# Patient Record
Sex: Female | Born: 1960
Health system: Southern US, Community
[De-identification: ages and names within clinical notes are randomized; demographics above are authoritative.]

## PROBLEM LIST (undated history)

## (undated) DIAGNOSIS — E559 Vitamin D deficiency, unspecified: Secondary | ICD-10-CM

## (undated) DIAGNOSIS — D72829 Elevated white blood cell count, unspecified: Secondary | ICD-10-CM

## (undated) DIAGNOSIS — E8881 Metabolic syndrome: Secondary | ICD-10-CM

## (undated) DIAGNOSIS — K649 Unspecified hemorrhoids: Secondary | ICD-10-CM

## (undated) DIAGNOSIS — I248 Other forms of acute ischemic heart disease: Secondary | ICD-10-CM

## (undated) DIAGNOSIS — Z6830 Body mass index (BMI) 30.0-30.9, adult: Secondary | ICD-10-CM

## (undated) DIAGNOSIS — I480 Paroxysmal atrial fibrillation: Secondary | ICD-10-CM

## (undated) DIAGNOSIS — N179 Acute kidney failure, unspecified: Secondary | ICD-10-CM

## (undated) DIAGNOSIS — J069 Acute upper respiratory infection, unspecified: Secondary | ICD-10-CM

## (undated) DIAGNOSIS — E669 Obesity, unspecified: Secondary | ICD-10-CM

## (undated) DIAGNOSIS — N840 Polyp of corpus uteri: Secondary | ICD-10-CM

## (undated) DIAGNOSIS — B9789 Other viral agents as the cause of diseases classified elsewhere: Secondary | ICD-10-CM

## (undated) DIAGNOSIS — Z9289 Personal history of other medical treatment: Secondary | ICD-10-CM

## (undated) HISTORY — DX: Obesity, unspecified: E66.9

## (undated) HISTORY — PX: OTHER SURGICAL HISTORY: SHX169

## (undated) HISTORY — PX: CERVICAL POLYPECTOMY: SHX88

## (undated) HISTORY — DX: Acute upper respiratory infection, unspecified: J06.9

## (undated) HISTORY — DX: Metabolic syndrome: E88.81

## (undated) HISTORY — DX: Acute kidney failure, unspecified: N17.9

## (undated) HISTORY — DX: Other forms of acute ischemic heart disease: I24.8

## (undated) HISTORY — DX: Unspecified hemorrhoids: K64.9

## (undated) HISTORY — DX: Elevated white blood cell count, unspecified: D72.829

## (undated) HISTORY — DX: Vitamin D deficiency, unspecified: E55.9

## (undated) HISTORY — DX: Other viral agents as the cause of diseases classified elsewhere: B97.89

## (undated) HISTORY — DX: Paroxysmal atrial fibrillation: I48.0

## (undated) HISTORY — DX: Personal history of other medical treatment: Z92.89

## (undated) HISTORY — DX: Body mass index (bmi) 30.0-30.9, adult: Z68.30

## (undated) HISTORY — PX: TUBAL LIGATION: SHX77

---

## 1999-09-14 ENCOUNTER — Other Ambulatory Visit: Admission: RE | Admit: 1999-09-14 | Discharge: 1999-09-14 | Payer: Self-pay | Admitting: Obstetrics and Gynecology

## 2000-08-13 ENCOUNTER — Encounter: Admission: RE | Admit: 2000-08-13 | Discharge: 2000-11-11 | Payer: Self-pay | Admitting: *Deleted

## 2000-08-26 ENCOUNTER — Encounter: Admission: RE | Admit: 2000-08-26 | Discharge: 2000-11-24 | Payer: Self-pay | Admitting: Orthopedic Surgery

## 2001-02-12 ENCOUNTER — Other Ambulatory Visit: Admission: RE | Admit: 2001-02-12 | Discharge: 2001-02-12 | Payer: Self-pay | Admitting: Obstetrics and Gynecology

## 2002-10-04 ENCOUNTER — Encounter: Payer: Self-pay | Admitting: Emergency Medicine

## 2002-10-04 ENCOUNTER — Emergency Department (HOSPITAL_COMMUNITY): Admission: EM | Admit: 2002-10-04 | Discharge: 2002-10-04 | Payer: Self-pay | Admitting: Emergency Medicine

## 2004-03-02 ENCOUNTER — Encounter: Admission: RE | Admit: 2004-03-02 | Discharge: 2004-03-02 | Payer: Self-pay | Admitting: Obstetrics and Gynecology

## 2004-03-23 ENCOUNTER — Other Ambulatory Visit: Admission: RE | Admit: 2004-03-23 | Discharge: 2004-03-23 | Payer: Self-pay | Admitting: Obstetrics and Gynecology

## 2007-09-25 ENCOUNTER — Ambulatory Visit (HOSPITAL_COMMUNITY): Admission: RE | Admit: 2007-09-25 | Discharge: 2007-09-25 | Payer: Self-pay | Admitting: Obstetrics and Gynecology

## 2007-09-25 ENCOUNTER — Encounter (INDEPENDENT_AMBULATORY_CARE_PROVIDER_SITE_OTHER): Payer: Self-pay | Admitting: Obstetrics and Gynecology

## 2010-03-19 ENCOUNTER — Encounter: Admission: RE | Admit: 2010-03-19 | Discharge: 2010-03-19 | Payer: Self-pay | Admitting: Gastroenterology

## 2011-03-23 ENCOUNTER — Inpatient Hospital Stay (INDEPENDENT_AMBULATORY_CARE_PROVIDER_SITE_OTHER)
Admission: RE | Admit: 2011-03-23 | Discharge: 2011-03-23 | Disposition: A | Discharge status: Home / Self Care | Payer: 59 | Source: Ambulatory Visit | Attending: Family Medicine | Admitting: Family Medicine

## 2011-03-23 ENCOUNTER — Ambulatory Visit (INDEPENDENT_AMBULATORY_CARE_PROVIDER_SITE_OTHER): Payer: 59

## 2011-03-23 DIAGNOSIS — S63509A Unspecified sprain of unspecified wrist, initial encounter: Secondary | ICD-10-CM

## 2011-03-23 DIAGNOSIS — IMO0002 Reserved for concepts with insufficient information to code with codable children: Secondary | ICD-10-CM

## 2011-04-02 NOTE — Op Note (Signed)
Lindsey Bishop, TRAVAGLINI                 ACCOUNT NO.:  [id number]   MEDICAL RECORD NO.:  [id number]          PATIENT TYPE:  AMB   LOCATION:  SDC                           FACILITY:  [facility]   PHYSICIAN:  [name], [name]   DATE OF BIRTH:  11-Oct-1961   DATE OF PROCEDURE:  09/25/2007  DATE OF DISCHARGE:                               OPERATIVE REPORT   PREOPERATIVE DIAGNOSIS:  Endometrial polyp.   POSTOPERATIVE DIAGNOSIS:  Endometrial polyp.   PROCEDURE:  Paracervical block.  Hysteroscopy, resection of polyps and  endometrial curettings.   SURGEON:  [name], [name]   ANESTHESIA:  Was sedation with paracervical block.   ESTIMATED BLOOD LOSS:  Minimal.   PACKS AND DRAINS:  None.   INTRAOPERATIVE BLOOD REPLACED:  None.   COMPLICATIONS:  None.   INDICATIONS:  Dictated history and physical.   PROCEDURE AS FOLLOWS:  The patient was taken to OR, placed in supine  position.  After satisfactory level of sedation, the patient placed in  the dorsal spine position using Allen stirrups.  Patient then draped in  a sterile field.  Speculum was placed in the vaginal vault.  Cervix and  vagina were cleansed with Betadine.  Paracervical block was instituted  using 1% Nesacaine.  Cervix was grasped with a single-toothed tenaculum.  Uterus sounded to approximately 8 cm.  Cervix serially dilated to size  35 Pratt dilator.  Operative hysteroscope was introduced.  Intrauterine  cavity was distended using sorbitol.  Visualization revealed several  small polyps on the anterior uterine wall.  These were resected and sent  for pathological review.  Multiple endometrial biopsies were obtained  along with curettings.  Total deficit was 50 mL.  At this point in time,  speculum and single-toothed tenaculum were removed.  The patient was  taken out of the dorsal lithotomy position.  Once alert, transferred to  recovery room in good condition.  Sponge, instrument and needle count  was reported as correct by  the circulating nurse.      [name], [name]  Electronically Signed     JSM/MEDQ  D:  09/25/2007  T:  09/25/2007  Job:  [id number]

## 2011-04-02 NOTE — H&P (Signed)
NAME:  Lindsey Bishop, Lindsey Bishop NO.:  [id number]   MEDICAL RECORD NO.:  [id number]           PATIENT TYPE:   LOCATION:                                 FACILITY:   PHYSICIAN:  [name], [name]   DATE OF BIRTH:  1961/11/05   DATE OF ADMISSION:  09/25/2007  DATE OF DISCHARGE:  09/25/2007                              HISTORY & PHYSICAL   The patient is a 50 year old gravida 5, para 4, abortus 1 female who  presents for hysteroscopy.   In relation to the present admission the patient is having increasing  menstrual irregularities with prolonged heavy cycles.  She underwent a  saline infusion ultrasound that revealed a large endometrial polyp.  She  now proceeds for hysteroscopic resection.   ALLERGIES:  No known drug allergies.   MEDICATIONS:  None.   PAST MEDICAL HISTORY:  Usual childhood diseases.  No significant  sequelae.   PAST SURGICAL HISTORY:  She has had knee surgery.  She also had a prior  cesarean section.  She has had two vaginal deliveries and one  miscarriage.   SOCIAL HISTORY:  Reveals no tobacco or alcohol use.   FAMILY HISTORY:  History of diabetes as well as heart attack.   REVIEW OF SYSTEMS:  Noncontributory.   PHYSICAL EXAMINATION:  VITAL SIGNS:  Afebrile, stable vital signs.  HEENT:  Normocephalic.  Pupils equal, round, and reactive to light and  accommodation.  Extraocular movements were intact.  Sclerae and  conjunctivae are clear.  Oropharynx clear.  NECK:  Without thyromegaly.  BREASTS:  No discrete masses.  LUNGS:  Clear.  CARDIOVASCULAR:  Regular rhythm and rate without murmurs or gallops.  ABDOMEN:  Benign.  No mass, organomegaly or tenderness.  PELVIC:  Normal external genitalia.  Vaginal mucosa is clear.  Cervix  unremarkable.  Uterus normal size, shape, and contour.  Adnexa free of  masses or tenderness.  EXTREMITIES:  Trace edema.  NEUROLOGICAL:  Grossly within normal limits.   IMPRESSION:  Menorrhagia with associated  endometrial polyp.   PLAN:  The patient to undergo hysteroscopic evaluation and resection.  Risks have been discussed including the risk of infection.  The risk of  hemorrhage could require transfusion with the risk of AIDS or hepatitis.  Risk of injury to adjacent organs including bladder, bowel, ureters that  could require further exploratory surgery.  Risk of deep venous  thrombosis and pulmonary emboli.  The patient expressed understanding of  indications and risks.      [name], [name]  Electronically Signed     JSM/MEDQ  D:  09/25/2007  T:  09/25/2007  Job:  [id number]

## 2011-08-27 LAB — CBC
Platelets: 255
WBC: 7.1

## 2011-08-27 LAB — HCG, SERUM, QUALITATIVE: Preg, Serum: NEGATIVE

## 2011-10-24 ENCOUNTER — Other Ambulatory Visit: Payer: Self-pay | Admitting: Obstetrics and Gynecology

## 2011-10-24 DIAGNOSIS — R928 Other abnormal and inconclusive findings on diagnostic imaging of breast: Secondary | ICD-10-CM

## 2011-10-25 ENCOUNTER — Ambulatory Visit
Admission: RE | Admit: 2011-10-25 | Discharge: 2011-10-25 | Disposition: A | Discharge status: Home / Self Care | Payer: 59 | Source: Ambulatory Visit | Attending: Obstetrics and Gynecology | Admitting: Obstetrics and Gynecology

## 2011-10-25 DIAGNOSIS — R928 Other abnormal and inconclusive findings on diagnostic imaging of breast: Secondary | ICD-10-CM

## 2013-12-02 ENCOUNTER — Ambulatory Visit: Payer: 59 | Admitting: Cardiology

## 2014-09-26 ENCOUNTER — Telehealth: Payer: Self-pay | Admitting: Cardiology

## 2014-09-26 NOTE — Telephone Encounter (Signed)
Spoke with patient who reports increased episodes of a fib. She is out of Metoprolol. States its making her very anxious. i recommended she see a PA tomorrow, and she is agreeable. Appointment with Kathleen Argue at 10:30 am.

## 2014-09-26 NOTE — Telephone Encounter (Signed)
Ms.Kearse is stating that she is having episodes of AFib and cannot wait until next week to see the doctor .She have had 5 episodes the weekend before last and had one on last night around 11pm . Please call   Thanks

## 2014-09-26 NOTE — Telephone Encounter (Signed)
New problem   Pt stated she saw Dr Marlou Porch year ago\. Pt is having problems with afib and want an appt asap b/c she has given out of her meds since the weekend. Please call pt.I couldn't verify the exact date the the pt was actually here. Pt didn't want to see a PA/NP.

## 2014-09-27 ENCOUNTER — Encounter: Payer: Self-pay | Admitting: Physician Assistant

## 2014-09-27 ENCOUNTER — Ambulatory Visit (INDEPENDENT_AMBULATORY_CARE_PROVIDER_SITE_OTHER): Payer: 59 | Admitting: Physician Assistant

## 2014-09-27 VITALS — BP 130/85 | HR 65 | Ht 71.0 in | Wt 214.0 lb

## 2014-09-27 DIAGNOSIS — I480 Paroxysmal atrial fibrillation: Secondary | ICD-10-CM

## 2014-09-27 DIAGNOSIS — R002 Palpitations: Secondary | ICD-10-CM

## 2014-09-27 LAB — BASIC METABOLIC PANEL
BUN: 19 mg/dL (ref 6–23)
CALCIUM: 9.8 mg/dL (ref 8.4–10.5)
CO2: 26 meq/L (ref 19–32)
Chloride: 108 mEq/L (ref 96–112)
Creatinine, Ser: 0.8 mg/dL (ref 0.4–1.2)
GFR: 81.12 mL/min (ref >60.00)
GLUCOSE: 89 mg/dL (ref 70–99)
Potassium: 4 mEq/L (ref 3.5–5.1)
SODIUM: 138 meq/L (ref 135–145)

## 2014-09-27 LAB — TSH: TSH: 1.59 u[IU]/mL (ref 0.35–4.50)

## 2014-09-27 MED ORDER — METOPROLOL TARTRATE 25 MG PO TABS: 12.5000 mg | ORAL_TABLET | Freq: Two times a day (BID) | ORAL | Status: DC

## 2014-09-27 NOTE — Progress Notes (Addendum)
Cardiology Office Note   Date:  09/27/2014   ID:  Lindsey Bishop, DOB 27-Sep-1961, MRN [id number]  PCP:  No primary care provider on file.  Cardiologist:  Dr. Candee Furbish     History of Present Illness: Lindsey Bishop is a 53 y.o. female with a hx of PAF.  She had drug induced Hepatitis from [facility] in 2011.  She has taken prn Metoprolol in the past for recurrent AFib/palpitations.  She last saw Dr. Candee Furbish in 11/2012.  She returns today for the evaluation of recurrent AFib.  She's had 4 episodes of probable atrial fibrillation over last several weeks. She has noted increased stress. Her episodes of atrial fibrillation seem to be more frequent. Her most recent episode lasted about 18 hours and stopped yesterday afternoon.  She feels lightheaded with this as well as fatigue. She did have one episode while doing exertion (yard work).  She also has episodes at rest.  She denies chest pain or significant dyspnea. She denies orthopnea, PND or edema. She denies syncope. She denies a history of snoring or significant daytime hypersomnolence.   Recent Labs/Images:  No results found for requested labs within last 365 days.     Wt Readings from Last 3 Encounters:  09/27/14 214 lb (97.07 kg)     Past Medical History  Diagnosis Date  . PAF (paroxysmal atrial fibrillation)     a. Multaq 4/11 >>> acute hepatitis (AST 1215, ALT 1343) - Multaq DC'd    Current Outpatient Prescriptions  Medication Sig Dispense Refill  . metoprolol tartrate (LOPRESSOR) 25 MG tablet Take 0.5 tablets (12.5 mg total) by mouth 2 (two) times daily. Ok to take extra 1/2-1 tab prn for palpatations 30 tablet 11   No current facility-administered medications for this visit.     Allergies:   Multaq and Meperidine   Social History:  The patient  reports that she has never smoked. She does not have any smokeless tobacco history on file.   Family History:  [name] family history includes Diabetes in her  brother, father, maternal grandmother, and other family members; Heart attack in her maternal grandmother. There is no history of Stroke.   ROS:  Please see the history of present illness.    All other systems reviewed and negative.    PHYSICAL EXAM: VS:  BP 130/85 mmHg  Pulse 65  Ht 5\' 11"  (1.803 m)  Wt 214 lb (97.07 kg)  BMI 29.86 kg/m2 Well nourished, well developed, in no acute distress HEENT: normal Neck: no JVD Cardiac:  normal S1, S2; RRR; no murmur Lungs:  clear to auscultation bilaterally, no wheezing, rhonchi or rales Abd: soft, nontender, no hepatomegaly Ext: no edema Skin: warm and dry Neuro:  CNs 2-12 intact, no focal abnormalities noted  EKG:  NSR, HR 65, rightward axis      ASSESSMENT AND PLAN:  1.  Paroxysmal atrial fibrillation:  Patient notes recurrent episodes of probable atrial fibrillation. She's not certain if this is more related to increased stress or overall increased frequency. She's had some symptoms with exertion. She has also had some symptoms at rest. She had significant drug-induced hepatitis in 2011 in the setting of Multaq. She is hesitant to try any other antiarrhythmic drugs. She is interested in finding out more information regarding PVI ablation and whether or not she is a candidate.  CHADS2-VASc=1 (gender).       -  Change metoprolol tartrate to 12.5 mg twice a day.    -  She can take an extra 12.5-25 mg daily as needed.    -  Check BMET, TSH.    -  Arrange 2-D echocardiogram to rule out significant atriopathy, LV dysfunction, structural heart disease.    -  Arrange ETT to rule out exercise-induced arrhythmias, ischemia.    -  Arrange Event monitor to assess AFib burden.    -  Could consider class IC agent if ischemia ruled out.  Again, she is quite hesitant to do this.  Disposition:   Refer to Dr. Thompson Grayer in the AFib clinic to FU in 6 weeks.   Signed, Versie Starks, MHS 09/27/2014 12:27 PM    [facility]  [facility], [place], [place]  [address] Phone: [phone]; Fax: [phone]

## 2014-09-27 NOTE — Patient Instructions (Signed)
START METOPROLOL 12.5 MG TWICE DAILY; OK TO TAKE EXTRA 1/2-1 TABLET AS NEEDED FOR PALPITATIONS  LAB WORK TODAY; BMET, TSH  Your physician has requested that you have an echocardiogram. Echocardiography is a painless test that uses sound waves to create images of your heart. It provides your doctor with information about the size and shape of your heart and how well your heart's chambers and valves are working. This procedure takes approximately one hour. There are no restrictions for this procedure.  Your physician has requested that you have an exercise tolerance test. For further information please visit [email or web address]. Please also follow instruction sheet, as given.  Your physician has recommended that you wear an event monitor. Event monitors are medical devices that record the heart's electrical activity. Doctors most often [place] these monitors to diagnose arrhythmias. Arrhythmias are problems with the speed or rhythm of the heartbeat. The monitor is a small, portable device. You can wear one while you do your normal daily activities. This is usually used to diagnose what is causing palpitations/syncope (passing out).  You have been referred to A-FIB CLINIC WITH DR. ALLRED 6 WEEKS

## 2014-09-29 ENCOUNTER — Encounter: Payer: Self-pay | Admitting: Physician Assistant

## 2014-09-29 ENCOUNTER — Encounter: Payer: Self-pay | Admitting: *Deleted

## 2014-09-29 ENCOUNTER — Ambulatory Visit (INDEPENDENT_AMBULATORY_CARE_PROVIDER_SITE_OTHER): Payer: 59 | Admitting: Radiology

## 2014-09-29 ENCOUNTER — Ambulatory Visit (HOSPITAL_COMMUNITY): Payer: 59 | Attending: Cardiology

## 2014-09-29 DIAGNOSIS — R002 Palpitations: Secondary | ICD-10-CM

## 2014-09-29 DIAGNOSIS — I480 Paroxysmal atrial fibrillation: Secondary | ICD-10-CM

## 2014-09-29 DIAGNOSIS — I4891 Unspecified atrial fibrillation: Secondary | ICD-10-CM | POA: Diagnosis present

## 2014-09-29 NOTE — Progress Notes (Signed)
Patient ID: Lindsey Bishop, female   DOB: [birth year], 53 y.o.   MRN: [id number] Lifewatch 30 day cardiac event monitor applied to patient.

## 2014-09-29 NOTE — Progress Notes (Signed)
2D Echo completed. 09/29/2014

## 2014-09-29 NOTE — Progress Notes (Signed)
Patient ID: Lindsey Bishop, female   DOB: [birth year], 53 y.o.   MRN: [id number] Lifewatch 30 day cardiac event monitor applied to patient.

## 2014-09-30 ENCOUNTER — Telehealth: Payer: Self-pay | Admitting: *Deleted

## 2014-09-30 NOTE — Telephone Encounter (Signed)
pt notified about echo results with verbal understanding 

## 2014-10-05 ENCOUNTER — Ambulatory Visit: Payer: 59 | Admitting: Cardiology

## 2014-10-21 ENCOUNTER — Ambulatory Visit: Payer: 59 | Admitting: Physician Assistant

## 2014-10-27 ENCOUNTER — Encounter: Payer: Self-pay | Admitting: *Deleted

## 2014-10-31 ENCOUNTER — Encounter: Payer: 59 | Admitting: Physician Assistant

## 2014-11-07 ENCOUNTER — Telehealth: Payer: Self-pay | Admitting: *Deleted

## 2014-11-07 NOTE — Telephone Encounter (Signed)
Pt returned call

## 2014-11-07 NOTE — Telephone Encounter (Signed)
Left message for pt to call back for results.

## 2014-11-07 NOTE — Telephone Encounter (Signed)
Left message for pt to call back to discuss results of event monitor which demonstrated rare At Fib per Dr Marlou Porch.  No new orders were given.  She is to keep her appt as scheduled by Richardson Dopp with Dr Rayann Heman 12/23.

## 2014-11-07 NOTE — Telephone Encounter (Signed)
New Message  Pt returning [place] phone call; please call back and discuss.

## 2014-11-07 NOTE — Telephone Encounter (Signed)
Pt aware of results and will follow up as scheduled.

## 2014-11-09 ENCOUNTER — Ambulatory Visit (INDEPENDENT_AMBULATORY_CARE_PROVIDER_SITE_OTHER): Payer: 59 | Admitting: Internal Medicine

## 2014-11-09 ENCOUNTER — Encounter: Payer: Self-pay | Admitting: Internal Medicine

## 2014-11-09 VITALS — BP 100/80 | HR 63 | Ht 71.0 in | Wt 214.8 lb

## 2014-11-09 DIAGNOSIS — I480 Paroxysmal atrial fibrillation: Secondary | ICD-10-CM

## 2014-11-09 NOTE — Patient Instructions (Signed)
Your physician wants you to follow-up in: 6 months with Dr. Marlou Porch.You will receive a reminder letter in the mail two months in advance. If you don't receive a letter, please call our office to schedule the follow-up appointment.   As needed with Dr. Rayann Heman.

## 2014-11-10 ENCOUNTER — Encounter: Payer: Self-pay | Admitting: Internal Medicine
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
## 2014-11-17 ENCOUNTER — Encounter (HOSPITAL_COMMUNITY): Payer: Self-pay | Admitting: Emergency Medicine

## 2014-11-17 ENCOUNTER — Emergency Department (HOSPITAL_COMMUNITY)
Admission: EM | Admit: 2014-11-17 | Discharge: 2014-11-17 | Disposition: A | Discharge status: Home / Self Care | Payer: 59 | Attending: Emergency Medicine | Admitting: Emergency Medicine

## 2014-11-17 DIAGNOSIS — Z8679 Personal history of other diseases of the circulatory system: Secondary | ICD-10-CM | POA: Insufficient documentation

## 2014-11-17 DIAGNOSIS — M5440 Lumbago with sciatica, unspecified side: Secondary | ICD-10-CM | POA: Diagnosis not present

## 2014-11-17 DIAGNOSIS — Z79899 Other long term (current) drug therapy: Secondary | ICD-10-CM | POA: Insufficient documentation

## 2014-11-17 DIAGNOSIS — M545 Low back pain: Secondary | ICD-10-CM | POA: Diagnosis present

## 2014-11-17 HISTORY — DX: Polyp of corpus uteri: N84.0

## 2014-11-17 MED ORDER — OXYCODONE HCL 5 MG PO TABS: 5.0000 mg | ORAL_TABLET | ORAL | Status: DC | PRN

## 2014-11-17 MED ORDER — OXYCODONE HCL 5 MG PO TABS
5.0000 mg | ORAL_TABLET | Freq: Once | ORAL | Status: AC
  Administered 2014-11-17: 5 mg via ORAL
  Filled 2014-11-17: qty 1

## 2014-11-17 MED ORDER — PREDNISONE 20 MG PO TABS: 40.0000 mg | ORAL_TABLET | Freq: Every day | ORAL | Status: DC

## 2014-11-17 MED ORDER — MELOXICAM 7.5 MG PO TABS: 15.0000 mg | ORAL_TABLET | Freq: Every day | ORAL | Status: DC

## 2014-11-17 MED ORDER — HYDROMORPHONE HCL 1 MG/ML IJ SOLN
1.0000 mg | Freq: Once | INTRAMUSCULAR | Status: AC
  Administered 2014-11-17: 1 mg via INTRAMUSCULAR
  Filled 2014-11-17: qty 1

## 2014-11-17 MED ORDER — KETOROLAC TROMETHAMINE 60 MG/2ML IM SOLN
60.0000 mg | Freq: Once | INTRAMUSCULAR | Status: AC
  Administered 2014-11-17: 60 mg via INTRAMUSCULAR
  Filled 2014-11-17: qty 2

## 2014-11-17 MED ORDER — DEXAMETHASONE SODIUM PHOSPHATE 10 MG/ML IJ SOLN
10.0000 mg | Freq: Once | INTRAMUSCULAR | Status: AC
  Administered 2014-11-17: 10 mg via INTRAMUSCULAR
  Filled 2014-11-17: qty 1

## 2014-11-17 NOTE — ED Notes (Signed)
Pt reports bulgding disc at L5, has been given injections 10 years ago, and began having intense back pain yesterday. Seen by Dr. Tonita Cong, [facility], yesterday and placed on Tramadol until she can be seen next week for injections again. Pt stated tonight the pain became unbearable. Pt ambulatory with steady gait to triage.

## 2014-11-17 NOTE — ED Notes (Signed)
Pt ambulating independently w/ steady gait on d/c in no acute distress, A&Ox4. D/c instructions reviewed w/ pt and family - pt and family deny any further questions or concerns at present. Rx given x3  

## 2014-11-17 NOTE — Discharge Instructions (Signed)
Take prednisone and Mobic as prescribed for symptoms. You may take oxycodone as prescribed for breakthrough pain control. Recommend that you refrain from strenuous activity or heavy lifting. Apply ice and heat to your back daily, at least 3-4 times per day. Follow-up with your orthopedist for further management of her symptoms. You may return to the emergency department as needed if symptoms worsen.  Back Pain, Adult Low back pain is very common. About 1 in 5 people have back pain.The cause of low back pain is rarely dangerous. The pain often gets better over time.About half of people with a sudden onset of back pain feel better in just 2 weeks. About 8 in 10 people feel better by 6 weeks.  CAUSES Some common causes of back pain include:  Strain of the muscles or ligaments supporting the spine.  Wear and tear (degeneration) of the spinal discs.  Arthritis.  Direct injury to the back. DIAGNOSIS Most of the time, the direct cause of low back pain is not known.However, back pain can be treated effectively even when the exact cause of the pain is unknown.Answering your caregiver's questions about your overall health and symptoms is one of the most accurate ways to make sure the cause of your pain is not dangerous. If your caregiver needs more information, he or she may order lab work or imaging tests (X-rays or MRIs).However, even if imaging tests show changes in your back, this usually does not require surgery. HOME CARE INSTRUCTIONS For many people, back pain returns.Since low back pain is rarely dangerous, it is often a condition that people can learn to [facility] their own.   Remain active. It is stressful on the back to sit or stand in one place. Do not sit, drive, or stand in one place for more than 30 minutes at a time. Take short walks on level surfaces as soon as pain allows.Try to increase the length of time you walk each day.  Do not stay in bed.Resting more than 1 or 2 days can  delay your recovery.  Do not avoid exercise or work.Your body is made to move.It is not dangerous to be active, even though your back may hurt.Your back will likely heal faster if you return to being active before your pain is gone.  Pay attention to your body when you bend and lift. Many people have less discomfortwhen lifting if they bend their knees, keep the load close to their bodies,and avoid twisting. Often, the most comfortable positions are those that put less stress on your recovering back.  Find a comfortable position to sleep. Use a firm mattress and lie on your side with your knees slightly bent. If you lie on your back, put a pillow under your knees.  Only take over-the-counter or prescription medicines as directed by your caregiver. Over-the-counter medicines to reduce pain and inflammation are often the most helpful.Your caregiver may prescribe muscle relaxant drugs.These medicines help dull your pain so you can more quickly return to your normal activities and healthy exercise.  Put ice on the injured area.  Put ice in a plastic bag.  Place a towel between your skin and the bag.  Leave the ice on for 15-20 minutes, 03-04 times a day for the first 2 to 3 days. After that, ice and heat may be alternated to reduce pain and spasms.  Ask your caregiver about trying back exercises and gentle massage. This may be of some benefit.  Avoid feeling anxious or stressed.Stress increases muscle tension  and can worsen back pain.It is important to recognize when you are anxious or stressed and learn ways to manage it.Exercise is a great option. SEEK MEDICAL CARE IF:  You have pain that is not relieved with rest or medicine.  You have pain that does not improve in 1 week.  You have new symptoms.  You are generally not feeling well. SEEK IMMEDIATE MEDICAL CARE IF:   You have pain that radiates from your back into your legs.  You develop new bowel or bladder control  problems.  You have unusual weakness or numbness in your arms or legs.  You develop nausea or vomiting.  You develop abdominal pain.  You feel faint. Document Released: 11/04/2005 Document Revised: 05/05/2012 Document Reviewed: 03/08/2014 [facility] Patient Information 2015 [place], [place]. This information is not intended to replace advice given to you by your health care provider. Make sure you discuss any questions you have with your health care provider.
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
## 2014-11-17 NOTE — ED Notes (Signed)
Pt reports acute on chronic back pain x2 days, states she saw her PCP, Dr. Maxie Better and was told she was going to be scheduled for "injections" to help improve pt's pain however pt states tonight pain is acutely worse - she is unable to sit, stand or lie down. Pt pleasant and cooperative, A&Ox4. Denies any known mechanism of injury.

## 2014-12-23 ENCOUNTER — Emergency Department (HOSPITAL_COMMUNITY): Payer: 59

## 2014-12-23 ENCOUNTER — Observation Stay (HOSPITAL_COMMUNITY)
Admission: EM | Admit: 2014-12-23 | Discharge: 2014-12-25 | Disposition: A | Discharge status: Home / Self Care | Payer: 59 | Attending: Family Medicine | Admitting: Family Medicine

## 2014-12-23 ENCOUNTER — Encounter (HOSPITAL_COMMUNITY): Payer: Self-pay | Admitting: Emergency Medicine

## 2014-12-23 DIAGNOSIS — I248 Other forms of acute ischemic heart disease: Secondary | ICD-10-CM | POA: Diagnosis present

## 2014-12-23 DIAGNOSIS — Z79899 Other long term (current) drug therapy: Secondary | ICD-10-CM | POA: Insufficient documentation

## 2014-12-23 DIAGNOSIS — J069 Acute upper respiratory infection, unspecified: Secondary | ICD-10-CM | POA: Insufficient documentation

## 2014-12-23 DIAGNOSIS — E669 Obesity, unspecified: Secondary | ICD-10-CM | POA: Diagnosis not present

## 2014-12-23 DIAGNOSIS — Z6829 Body mass index (BMI) 29.0-29.9, adult: Secondary | ICD-10-CM | POA: Diagnosis present

## 2014-12-23 DIAGNOSIS — Z79891 Long term (current) use of opiate analgesic: Secondary | ICD-10-CM | POA: Diagnosis not present

## 2014-12-23 DIAGNOSIS — Z7952 Long term (current) use of systemic steroids: Secondary | ICD-10-CM | POA: Diagnosis not present

## 2014-12-23 DIAGNOSIS — I480 Paroxysmal atrial fibrillation: Secondary | ICD-10-CM | POA: Diagnosis present

## 2014-12-23 DIAGNOSIS — N179 Acute kidney failure, unspecified: Secondary | ICD-10-CM | POA: Diagnosis not present

## 2014-12-23 DIAGNOSIS — I959 Hypotension, unspecified: Secondary | ICD-10-CM | POA: Diagnosis not present

## 2014-12-23 DIAGNOSIS — B9789 Other viral agents as the cause of diseases classified elsewhere: Secondary | ICD-10-CM

## 2014-12-23 DIAGNOSIS — E663 Overweight: Secondary | ICD-10-CM | POA: Diagnosis present

## 2014-12-23 DIAGNOSIS — I4891 Unspecified atrial fibrillation: Secondary | ICD-10-CM

## 2014-12-23 DIAGNOSIS — Z6830 Body mass index (BMI) 30.0-30.9, adult: Secondary | ICD-10-CM | POA: Insufficient documentation

## 2014-12-23 DIAGNOSIS — I214 Non-ST elevation (NSTEMI) myocardial infarction: Secondary | ICD-10-CM

## 2014-12-23 DIAGNOSIS — D72829 Elevated white blood cell count, unspecified: Secondary | ICD-10-CM | POA: Diagnosis not present

## 2014-12-23 DIAGNOSIS — Z791 Long term (current) use of non-steroidal anti-inflammatories (NSAID): Secondary | ICD-10-CM | POA: Insufficient documentation

## 2014-12-23 LAB — CBC WITH DIFFERENTIAL/PLATELET
BASOS ABS: 0 10*3/uL (ref 0.0–0.1)
BASOS PCT: 0 % (ref 0–1)
Eosinophils Absolute: 0 10*3/uL (ref 0.0–0.7)
Eosinophils Relative: 0 % (ref 0–5)
HEMATOCRIT: 48.3 % — AB (ref 36.0–46.0)
HEMOGLOBIN: 16 g/dL — AB (ref 12.0–15.0)
LYMPHS ABS: 1.5 10*3/uL (ref 0.7–4.0)
Lymphocytes Relative: 7 % — ABNORMAL LOW (ref 12–46)
MCH: 30.1 pg (ref 26.0–34.0)
MCHC: 33.1 g/dL (ref 30.0–36.0)
MCV: 90.8 fL (ref 78.0–100.0)
MONO ABS: 1.8 10*3/uL — AB (ref 0.1–1.0)
Monocytes Relative: 8 % (ref 3–12)
NEUTROS PCT: 85 % — AB (ref 43–77)
Neutro Abs: 19 10*3/uL — ABNORMAL HIGH (ref 1.7–7.7)
PLATELETS: 250 10*3/uL (ref 150–400)
RBC: 5.32 MIL/uL — ABNORMAL HIGH (ref 3.87–5.11)
RDW: 13.7 % (ref 11.5–15.5)
WBC: 22.3 10*3/uL — AB (ref 4.0–10.5)

## 2014-12-23 LAB — BASIC METABOLIC PANEL
ANION GAP: 7 (ref 5–15)
BUN: 22 mg/dL (ref 6–23)
CALCIUM: 9.9 mg/dL (ref 8.4–10.5)
CHLORIDE: 106 mmol/L (ref 96–112)
CO2: 25 mmol/L (ref 19–32)
Creatinine, Ser: 1.3 mg/dL — ABNORMAL HIGH (ref 0.50–1.10)
GFR calc Af Amer: 54 mL/min — ABNORMAL LOW (ref >90)
GFR calc non Af Amer: 46 mL/min — ABNORMAL LOW (ref >90)
GLUCOSE: 98 mg/dL (ref 70–99)
POTASSIUM: 4 mmol/L (ref 3.5–5.1)
SODIUM: 138 mmol/L (ref 135–145)

## 2014-12-23 LAB — TROPONIN I: Troponin I: 0.27 ng/mL — ABNORMAL HIGH (ref <0.031)

## 2014-12-23 MED ORDER — DILTIAZEM LOAD VIA INFUSION
10.0000 mg | Freq: Once | INTRAVENOUS | Status: AC
  Administered 2014-12-23: 10 mg via INTRAVENOUS
  Filled 2014-12-23: qty 10

## 2014-12-23 MED ORDER — DILTIAZEM HCL 100 MG IV SOLR
5.0000 mg/h | INTRAVENOUS | Status: DC
  Administered 2014-12-23: 5 mg/h via INTRAVENOUS
  Filled 2014-12-23: qty 100

## 2014-12-23 MED ORDER — SODIUM CHLORIDE 0.9 % IV BOLUS (SEPSIS)
1000.0000 mL | Freq: Once | INTRAVENOUS | Status: AC
  Administered 2014-12-23: 1000 mL via INTRAVENOUS
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
## 2014-12-23 NOTE — ED Notes (Signed)
Pt reports she has hx of Afib and has been sick recently. Pt states she was dx with pneumonia. Pt tachy with heart rate jumping from 160-190. Pt is alert and oriented.

## 2014-12-23 NOTE — Progress Notes (Signed)
  CARE MANAGEMENT ED NOTE 12/23/2014  Patient:  Lindsey Bishop, Lindsey Bishop   Account Number:  [id number]  Date Initiated:  12/23/2014  Documentation initiated by:  Livia Snellen  Subjective/Objective Assessment:   Patient presents to Ed with tachycardia     Subjective/Objective Assessment Detail:   Patient with pmhx of PAF.  HR from 146-163bpm     Action/Plan:   Action/Plan Detail:   Anticipated DC Date:       Status Recommendation to Physician:   Result of Recommendation:    Other ED [place]  Other  PCP issues    Choice offered to / List presented to:            Status of service:  Completed, signed off  ED Comments:   ED Comments Detail:  EDCM spoke to patient at bedside.  Patient rpeorts she has [facility] insurance without a pcp.  Patient reports he cardiologist is Dr. Pryor Curia.  Patient reports she goes to Urgent Care when she is sick.  EDCM offered patient list of pcps who accept [facility] insurance and explained purpose and importance of having a pcp.  Patient refused list of pcps. [facility] informed patient that when she changed her mind about having a pcp to call the phone number on the back of her insurnace card or go to [place] website to help her find a pcp who is close to her and within network. Patient verbalized understanding.  No further EDCM needs at this time.

## 2014-12-24 DIAGNOSIS — I4891 Unspecified atrial fibrillation: Secondary | ICD-10-CM

## 2014-12-24 DIAGNOSIS — D72829 Elevated white blood cell count, unspecified: Secondary | ICD-10-CM

## 2014-12-24 DIAGNOSIS — N179 Acute kidney failure, unspecified: Secondary | ICD-10-CM | POA: Diagnosis present

## 2014-12-24 DIAGNOSIS — Z6829 Body mass index (BMI) 29.0-29.9, adult: Secondary | ICD-10-CM | POA: Insufficient documentation

## 2014-12-24 DIAGNOSIS — E669 Obesity, unspecified: Secondary | ICD-10-CM | POA: Insufficient documentation

## 2014-12-24 DIAGNOSIS — E663 Overweight: Secondary | ICD-10-CM | POA: Diagnosis present

## 2014-12-24 DIAGNOSIS — I214 Non-ST elevation (NSTEMI) myocardial infarction: Secondary | ICD-10-CM

## 2014-12-24 DIAGNOSIS — J069 Acute upper respiratory infection, unspecified: Secondary | ICD-10-CM

## 2014-12-24 DIAGNOSIS — I248 Other forms of acute ischemic heart disease: Secondary | ICD-10-CM | POA: Diagnosis present

## 2014-12-24 DIAGNOSIS — E66811 Obesity, class 1: Secondary | ICD-10-CM

## 2014-12-24 DIAGNOSIS — I2489 Other forms of acute ischemic heart disease: Secondary | ICD-10-CM

## 2014-12-24 DIAGNOSIS — B9789 Other viral agents as the cause of diseases classified elsewhere: Secondary | ICD-10-CM

## 2014-12-24 HISTORY — DX: Elevated white blood cell count, unspecified: D72.829

## 2014-12-24 HISTORY — DX: Obesity, unspecified: E66.9

## 2014-12-24 HISTORY — DX: Other forms of acute ischemic heart disease: I24.8

## 2014-12-24 HISTORY — DX: Acute upper respiratory infection, unspecified: J06.9

## 2014-12-24 HISTORY — DX: Acute kidney failure, unspecified: N17.9

## 2014-12-24 HISTORY — DX: Other forms of acute ischemic heart disease: I24.89

## 2014-12-24 HISTORY — DX: Obesity, class 1: E66.811

## 2014-12-24 LAB — CK TOTAL AND CKMB (NOT AT ARMC)
CK TOTAL: 37 U/L (ref 7–177)
CK, MB: 2 ng/mL (ref 0.3–4.0)
Relative Index: INVALID (ref 0.0–2.5)

## 2014-12-24 LAB — TROPONIN I
TROPONIN I: 0.33 ng/mL — AB (ref <0.031)
Troponin I: 0.36 ng/mL — ABNORMAL HIGH (ref <0.031)
Troponin I: 0.35 ng/mL — ABNORMAL HIGH (ref <0.031)

## 2014-12-24 LAB — MAGNESIUM: MAGNESIUM: 1.9 mg/dL (ref 1.5–2.5)

## 2014-12-24 MED ORDER — FLUTICASONE PROPIONATE 50 MCG/ACT NA SUSP
1.0000 | Freq: Every day | NASAL | Status: DC
  Administered 2014-12-24 – 2014-12-25 (×2): 1 via NASAL
  Filled 2014-12-24: qty 16

## 2014-12-24 MED ORDER — IBUPROFEN 200 MG PO TABS
400.0000 mg | ORAL_TABLET | Freq: Once | ORAL | Status: AC
  Administered 2014-12-24: 400 mg via ORAL
  Filled 2014-12-24: qty 2

## 2014-12-24 MED ORDER — HEPARIN (PORCINE) IN NACL 100-0.45 UNIT/ML-% IJ SOLN
1450.0000 [IU]/h | INTRAMUSCULAR | Status: DC
  Administered 2014-12-24: 1450 [IU]/h via INTRAVENOUS
  Filled 2014-12-24: qty 250

## 2014-12-24 MED ORDER — ASPIRIN EC 81 MG PO TBEC
81.0000 mg | DELAYED_RELEASE_TABLET | Freq: Every day | ORAL | Status: DC
  Administered 2014-12-24 – 2014-12-25 (×2): 81 mg via ORAL
  Filled 2014-12-24 (×2): qty 1

## 2014-12-24 MED ORDER — AZELASTINE HCL 0.1 % NA SOLN
1.0000 | Freq: Two times a day (BID) | NASAL | Status: DC
  Administered 2014-12-24 – 2014-12-25 (×3): 1 via NASAL
  Filled 2014-12-24: qty 30

## 2014-12-24 MED ORDER — SODIUM CHLORIDE 0.9 % IV SOLN
INTRAVENOUS | Status: AC
  Administered 2014-12-24: 09:00:00 via INTRAVENOUS

## 2014-12-24 MED ORDER — ACETAMINOPHEN 325 MG PO TABS: 650.0000 mg | ORAL_TABLET | ORAL | Status: DC | PRN

## 2014-12-24 MED ORDER — HEPARIN BOLUS VIA INFUSION
2500.0000 [IU] | Freq: Once | INTRAVENOUS | Status: AC
Start: 2014-12-24 — End: 2014-12-24
  Administered 2014-12-24: 2500 [IU] via INTRAVENOUS
  Filled 2014-12-24: qty 2500

## 2014-12-24 MED ORDER — AZITHROMYCIN 250 MG PO TABS
250.0000 mg | ORAL_TABLET | Freq: Every day | ORAL | Status: DC
  Administered 2014-12-25: 250 mg via ORAL
  Filled 2014-12-24: qty 1

## 2014-12-24 MED ORDER — AZITHROMYCIN 500 MG PO TABS
500.0000 mg | ORAL_TABLET | Freq: Every day | ORAL | Status: AC
  Administered 2014-12-24: 500 mg via ORAL
  Filled 2014-12-24: qty 1

## 2014-12-24 MED ORDER — ASPIRIN 81 MG PO CHEW
324.0000 mg | CHEWABLE_TABLET | Freq: Once | ORAL | Status: AC
  Administered 2014-12-24: 324 mg via ORAL
  Filled 2014-12-24: qty 4

## 2014-12-24 MED ORDER — ONDANSETRON HCL 4 MG/2ML IJ SOLN: 4.0000 mg | Freq: Four times a day (QID) | INTRAMUSCULAR | Status: DC | PRN
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
## 2014-12-24 NOTE — ED Notes (Signed)
HOSPITALIST IN ROOM unable to collect blood

## 2014-12-24 NOTE — ED Notes (Signed)
Pt holding overnight no stepdown beds at [facility].

## 2014-12-24 NOTE — ED Notes (Signed)
Pt off floor unable to draw

## 2014-12-24 NOTE — ED Notes (Signed)
Attempted to call the floor unable to take report due to pt not being approved for bed. Bed has been assigned and is ready.
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
## 2014-12-24 NOTE — Progress Notes (Signed)
ANTICOAGULATION CONSULT NOTE - Initial Consult  Pharmacy Consult for Heparin Indication: atrial fibrillation  Allergies  Allergen Reactions  . Multaq [Dronedarone] Palpitations    Causes AFIB occurences drug induced hepatitis  . Meperidine Nausea And Vomiting    Other Reaction: Other reaction    Patient Measurements: Height: 5\' 11"  (180.3 cm) Weight: 210 lb (95.255 kg) IBW/kg (Calculated) : 70.8 Heparin Dosing Weight:   Vital Signs: BP: 106/62 mmHg (02/06 0400) Pulse Rate: 85 (02/06 0400)  Labs:  Recent Labs  12/23/14 2108 12/23/14 2109  HGB 16.0*  --   HCT 48.3*  --   PLT 250  --   CREATININE  --  1.30*  TROPONINI  --  0.27*    Estimated Creatinine Clearance: 64.4 mL/min (by C-G formula based on Cr of 1.3).   Medical History: Past Medical History  Diagnosis Date  . PAF (paroxysmal atrial fibrillation)     a. Multaq 4/11 >>> acute hepatitis (AST 1215, ALT 1343) - Multaq DC'd  . Hx of echocardiogram     a. Echo (11/15):  EF 55-60%, no RWMA, normal diastolic function, normal RVF, mild TR, LA 33 mm  . Polyp, corpus uteri     Medications:  Infusions:  . diltiazem (CARDIZEM) infusion 8 mg/hr (12/24/14 0049)  . heparin 1,450 Units/hr (12/24/14 0118)    Assessment: Patient with afib. No oral anticoagulants noted on med rec.   Goal of Therapy:  Heparin level 0.3-0.7 units/ml Monitor platelets by anticoagulation protocol: Yes   Plan:  Heparin bolus 2500 units iv x1 Heparin drip at 1450  units/hr Daily  CBC Next heparin level at  0900    Tyler Deis, Shea Stakes Crowford 12/24/2014,4:59 AM

## 2014-12-24 NOTE — ED Notes (Signed)
PT is and has been having a heparin drip going.  RN notified a CBC was needed, sts she will inform phlebotomy when it is finish so that pt can have blood draw done.

## 2014-12-24 NOTE — Progress Notes (Signed)
Utilization Review completed.  

## 2014-12-25 ENCOUNTER — Other Ambulatory Visit: Payer: Self-pay | Admitting: Nurse Practitioner

## 2014-12-25 DIAGNOSIS — N179 Acute kidney failure, unspecified: Secondary | ICD-10-CM

## 2014-12-25 DIAGNOSIS — I480 Paroxysmal atrial fibrillation: Principal | ICD-10-CM

## 2014-12-25 DIAGNOSIS — D72829 Elevated white blood cell count, unspecified: Secondary | ICD-10-CM

## 2014-12-25 DIAGNOSIS — I248 Other forms of acute ischemic heart disease: Secondary | ICD-10-CM

## 2014-12-25 DIAGNOSIS — Z6830 Body mass index (BMI) 30.0-30.9, adult: Secondary | ICD-10-CM | POA: Diagnosis not present

## 2014-12-25 DIAGNOSIS — E669 Obesity, unspecified: Secondary | ICD-10-CM | POA: Diagnosis not present

## 2014-12-25 DIAGNOSIS — I4891 Unspecified atrial fibrillation: Secondary | ICD-10-CM

## 2014-12-25 DIAGNOSIS — I959 Hypotension, unspecified: Secondary | ICD-10-CM | POA: Diagnosis not present

## 2014-12-25 LAB — CBC WITH DIFFERENTIAL/PLATELET
BASOS PCT: 0 % (ref 0–1)
Basophils Absolute: 0 10*3/uL (ref 0.0–0.1)
EOS ABS: 0.1 10*3/uL (ref 0.0–0.7)
Eosinophils Relative: 2 % (ref 0–5)
HCT: 36.6 % (ref 36.0–46.0)
Hemoglobin: 11.8 g/dL — ABNORMAL LOW (ref 12.0–15.0)
LYMPHS ABS: 1.5 10*3/uL (ref 0.7–4.0)
Lymphocytes Relative: 25 % (ref 12–46)
MCH: 29.4 pg (ref 26.0–34.0)
MCHC: 32.2 g/dL (ref 30.0–36.0)
MCV: 91.3 fL (ref 78.0–100.0)
MONO ABS: 0.7 10*3/uL (ref 0.1–1.0)
Monocytes Relative: 12 % (ref 3–12)
NEUTROS ABS: 3.7 10*3/uL (ref 1.7–7.7)
NEUTROS PCT: 61 % (ref 43–77)
Platelets: 157 10*3/uL (ref 150–400)
RBC: 4.01 MIL/uL (ref 3.87–5.11)
RDW: 13.9 % (ref 11.5–15.5)
WBC: 6 10*3/uL (ref 4.0–10.5)

## 2014-12-25 LAB — CK TOTAL AND CKMB (NOT AT ARMC)
CK TOTAL: 31 U/L (ref 7–177)
CK, MB: 1.5 ng/mL (ref 0.3–4.0)
CK, MB: 1.1 ng/mL (ref 0.3–4.0)
Relative Index: INVALID (ref 0.0–2.5)
Relative Index: INVALID (ref 0.0–2.5)
Total CK: 35 U/L (ref 7–177)

## 2014-12-25 LAB — COMPREHENSIVE METABOLIC PANEL
ALK PHOS: 50 U/L (ref 39–117)
ALT: 13 U/L (ref 0–35)
ANION GAP: 4 — AB (ref 5–15)
AST: 17 U/L (ref 0–37)
Albumin: 2.9 g/dL — ABNORMAL LOW (ref 3.5–5.2)
BUN: 9 mg/dL (ref 6–23)
CO2: 22 mmol/L (ref 19–32)
Calcium: 8.5 mg/dL (ref 8.4–10.5)
Chloride: 117 mmol/L — ABNORMAL HIGH (ref 96–112)
Creatinine, Ser: 0.78 mg/dL (ref 0.50–1.10)
GFR calc non Af Amer: >90 mL/min (ref >90)
GLUCOSE: 90 mg/dL (ref 70–99)
Potassium: 3.6 mmol/L (ref 3.5–5.1)
SODIUM: 143 mmol/L (ref 135–145)
TOTAL PROTEIN: 5.6 g/dL — AB (ref 6.0–8.3)
Total Bilirubin: 0.6 mg/dL (ref 0.3–1.2)

## 2014-12-25 LAB — MAGNESIUM: MAGNESIUM: 2 mg/dL (ref 1.5–2.5)

## 2014-12-25 MED ORDER — AZITHROMYCIN 250 MG PO TABS: ORAL_TABLET | ORAL | Status: DC

## 2014-12-25 MED ORDER — FLUTICASONE PROPIONATE 50 MCG/ACT NA SUSP: 1.0000 | Freq: Every day | NASAL | Status: DC

## 2014-12-25 MED ORDER — ASPIRIN 81 MG PO TBEC: 81.0000 mg | DELAYED_RELEASE_TABLET | Freq: Every day | ORAL | Status: DC

## 2014-12-25 MED ORDER — AZELASTINE HCL 0.1 % NA SOLN: 1.0000 | Freq: Two times a day (BID) | NASAL | Status: DC

## 2014-12-25 MED ORDER — CARVEDILOL 3.125 MG PO TABS: 3.1250 mg | ORAL_TABLET | Freq: Two times a day (BID) | ORAL | Status: DC

## 2014-12-25 MED ORDER — CARVEDILOL 3.125 MG PO TABS
3.1250 mg | ORAL_TABLET | Freq: Two times a day (BID) | ORAL | Status: DC
  Administered 2014-12-25: 3.125 mg via ORAL
  Filled 2014-12-25: qty 1

## 2014-12-25 NOTE — Progress Notes (Signed)
UR completed 

## 2014-12-25 NOTE — Discharge Summary (Signed)
Physician Discharge Summary  Brita Jurgensen CNO:[id number] DOB: 11-24-1960 DOA: 12/23/2014  PCP: No primary care provider on file.  Admit date: 12/23/2014 Discharge date: 12/25/2014  Time spent: 25 minutes  Recommendations for Outpatient Follow-up:  1. Dr. Rayann Heman to see soon 2. Started Coreg this admit  3. Consider Cath as OP per Cardiology   Discharge Diagnoses:  Principal Problem:   Atrial fibrillation with RVR Active Problems:   Paroxysmal atrial fibrillation   AKI (acute kidney injury)   Obesity (BMI 30.0-34.9)   Viral URI with cough   Leukocytosis   Demand ischemia of myocardium - related to Afib RVR   Discharge Condition: good  Diet recommendation: hh low salt  Filed Weights   12/24/14 0048 12/24/14 0822  Weight: 95.255 kg (210 lb) 98.2 kg (216 lb 7.9 oz)    History of present illness:  53 ? paroxysmal A. fib since~2010-CHA2DS2-VASc Score and unadjusted Ischemic Stroke Rate (% per year) is equal to 0.6 % stroke rate/year from a score of 1previously on multaq but had to be taken off of this secondary to acute hepatitis, history of knee surgery, and otherwise bland medical history presented to walk-in clinic 12/23/14 feeling weak and poorly. Sent to ED for concerns of PNA/Hypotension 70/50's Found to be in Afib RVR  She's been having cold and upper respiratory symptoms for the past 1 weekcontext no contacts.  No diarrhea illness.  She has not had any vomiting.  She has had some sputum but is unclear as to what color it was +Sinus pressure with poor appetite  States "stress" sometimes causes her RVR Had elevated troponins 2/2 to demand ischemia without any CP  Cardiology recommended on d/c home a low dose of Coreg 3.125 bid and I have given her precations about its use as she has a low underlying basleine blood pressure I also started her on a Z-pack which she has been recommneded to complete I will CC her EP Cardiologist Allred   Discharge Exam: Filed Vitals:    12/25/14 0523  BP: 111/61  Pulse: 70  Temp: 97.9 F (36.6 C)  Resp: 18    General: EOMI,NCAT Cardiovascular: s1 s 2no m/r/g Respiratory: clear no added sound  Discharge Instructions   Discharge Instructions    Diet - low sodium heart healthy    Complete by:  As directed      Discharge instructions    Complete by:  As directed   Kindly follow up with Dr. Rayann Heman for further Afib management You have been prescribed a low dose of Cereg-which is similar to metorpolol.  Please check your blood pressure before you take it.  IF your blood pressure is below 100/50, do not take a dose. Please also complete the ENTIRE course of Azithromycin We have sent your Rx's to your pharmacy Take care and enjoy the Superbowl     Increase activity slowly    Complete by:  As directed           Current Discharge Medication List    START taking these medications   Details  aspirin EC 81 MG EC tablet Take 1 tablet (81 mg total) by mouth daily.    azelastine (ASTELIN) 0.1 % nasal spray Place 1 spray into both nostrils 2 (two) times daily. Use in each nostril as directed Qty: 30 mL, Refills: 12    azithromycin (ZITHROMAX) 250 MG tablet Finish course of medicine please Qty: 4 each, Refills: 0    carvedilol (COREG) 3.125 MG tablet Take 1  tablet (3.125 mg total) by mouth 2 (two) times daily with a meal. Qty: 60 tablet, Refills: 0    fluticasone (FLONASE) 50 MCG/ACT nasal spray Place 1 spray into both nostrils daily. Qty: 16 g, Refills: 2      STOP taking these medications     ibuprofen (ADVIL,MOTRIN) 200 MG tablet      metoprolol tartrate (LOPRESSOR) 25 MG tablet      meloxicam (MOBIC) 7.5 MG tablet      oxyCODONE (ROXICODONE) 5 MG immediate release tablet      predniSONE (DELTASONE) 20 MG tablet      traMADol (ULTRAM) 50 MG tablet        Allergies  Allergen Reactions  . Multaq [Dronedarone] Palpitations    Causes AFIB occurences drug induced hepatitis  . Meperidine Nausea And  Vomiting    Other Reaction: Other reaction  . Tylenol [Acetaminophen] Other (See Comments)    Per pt don't trust Tylenol, due to past experiences    Follow-up Information    Follow up with Thompson Grayer, MD.   Specialty:  Cardiology   Why:  we will arrange for stress testing and follow-up and contact you.   Contact information:   [address] [place] [address] [phone]        The results of significant diagnostics from this hospitalization (including imaging, microbiology, ancillary and laboratory) are listed below for reference.    Significant Diagnostic Studies: Dg Chest Port 1 View  12/23/2014   CLINICAL DATA:  Chills, cough and congestion for 4 days.  EXAM: PORTABLE CHEST - 1 VIEW  COMPARISON:  11/08/2009  FINDINGS: The cardiac silhouette, mediastinal and hilar contours are within normal limits and stable. There are mild chronic bronchitic changes and bibasilar scarring. No infiltrates, edema or effusions. The bony thorax is intact.  IMPRESSION: Chronic lung changes but no acute pulmonary findings.   Electronically Signed   By: Kalman Jewels [name]   On: 12/23/2014 21:40    Microbiology: No results found for this or any previous visit (from the past 240 hour(s)).   Labs: Basic Metabolic Panel:  Recent Labs Lab 12/23/14 2109 12/24/14 0842 12/25/14 0721  NA 138  --  143  K 4.0  --  3.6  CL 106  --  117*  CO2 25  --  22  GLUCOSE 98  --  90  BUN 22  --  9  CREATININE 1.30*  --  0.78  CALCIUM 9.9  --  8.5  MG  --  1.9 2.0   Liver Function Tests:  Recent Labs Lab 12/25/14 0721  AST 17  ALT 13  ALKPHOS 50  BILITOT 0.6  PROT 5.6*  ALBUMIN 2.9*   No results for input(s): LIPASE, AMYLASE in the last 168 hours. No results for input(s): AMMONIA in the last 168 hours. CBC:  Recent Labs Lab 12/23/14 2108 12/25/14 0721  WBC 22.3* 6.0  NEUTROABS 19.0* 3.7  HGB 16.0* 11.8*  HCT 48.3* 36.6  MCV 90.8 91.3  PLT 250 157   Cardiac  Enzymes:  Recent Labs Lab 12/23/14 2109 12/24/14 0842 12/24/14 1444 12/24/14 1510 12/24/14 2200 12/25/14 0721  CKTOTAL  --   --  37  --  35 31  CKMB  --   --  2.0  --  1.5 1.1  TROPONINI 0.27* 0.33*  --  0.36* 0.35*  --    BNP: BNP (last 3 results) No results for input(s): BNP in the last 8760 hours.  ProBNP (last 3 results) No results for input(s): PROBNP in the last 8760 hours.  CBG: No results for input(s): GLUCAP in the last 168 hours.     SignedNita Sells  [facility] 12/25/2014, 10:06 AM

## 2014-12-25 NOTE — Progress Notes (Signed)
Patient Name: Lindsey Bishop Date of Encounter: 12/25/2014  Principal Problem:   Atrial fibrillation with RVR Active Problems:   Paroxysmal atrial fibrillation   AKI (acute kidney injury)   Obesity (BMI 30.0-34.9)   Viral URI with cough   Leukocytosis   Demand ischemia of myocardium - related to Afib RVR   Length of Stay: 2  SUBJECTIVE  The patient feels significantly better and wants to go home.  CURRENT MEDS . aspirin EC  81 mg Oral Daily  . azelastine  1 spray Each Nare BID  . azithromycin  250 mg Oral Daily  . fluticasone  1 spray Each Nare Daily    OBJECTIVE  Filed Vitals:   12/24/14 0822 12/24/14 1526 12/24/14 2119 12/25/14 0523  BP: 103/64 122/67 117/65 111/61  Pulse: 78 91 92 70  Temp: 98.5 F (36.9 C) 98.6 F (37 C) 98.9 F (37.2 C) 97.9 F (36.6 C)  TempSrc: Oral Oral Oral Oral  Resp: 18 16 16 18   Height: 5\' 11"  (1.803 m)     Weight: 216 lb 7.9 oz (98.2 kg)     SpO2: 98% 96% 93% 96%    Intake/Output Summary (Last 24 hours) at 12/25/14 0839 Last data filed at 12/24/14 1500  Gross per 24 hour  Intake    645 ml  Output      0 ml  Net    645 ml   Filed Weights   12/24/14 0048 12/24/14 0822  Weight: 210 lb (95.255 kg) 216 lb 7.9 oz (98.2 kg)    PHYSICAL EXAM  General: Pleasant, NAD. Neuro: Alert and oriented X 3. Moves all extremities spontaneously. Psych: Normal affect. HEENT:  Normal  Neck: Supple without bruits or JVD. Lungs:  Resp regular and unlabored, CTA. Heart: RRR no s3, s4, or murmurs. Abdomen: Soft, non-tender, non-distended, BS + x 4.  Extremities: No clubbing, cyanosis or edema. DP/PT/Radials 2+ and equal bilaterally.  Accessory Clinical Findings  CBC  Recent Labs  12/23/14 2108 12/25/14 0721  WBC 22.3* 6.0  NEUTROABS 19.0* PENDING  HGB 16.0* 11.8*  HCT 48.3* 36.6  MCV 90.8 91.3  PLT 250 PENDING   Basic Metabolic Panel  Recent Labs  12/23/14 2109 12/24/14 0842 12/25/14 0721  NA 138  --  143  K 4.0  --   3.6  CL 106  --  117*  CO2 25  --  22  GLUCOSE 98  --  90  BUN 22  --  9  CREATININE 1.30*  --  0.78  CALCIUM 9.9  --  8.5  MG  --  1.9 2.0   Liver Function Tests  Recent Labs  12/25/14 0721  AST 17  ALT 13  ALKPHOS 50  BILITOT 0.6  PROT 5.6*  ALBUMIN 2.9*   Cardiac Enzymes  Recent Labs  12/24/14 0842 12/24/14 1444 12/24/14 1510 12/24/14 2200  CKTOTAL  --  37  --  35  CKMB  --  2.0  --  1.5  TROPONINI 0.33*  --  0.36* 0.35*    Radiology/Studies  Dg Chest Port 1 View  12/23/2014   CLINICAL DATA:  Chills, cough and congestion for 4 days.  EXAM: PORTABLE CHEST - 1 VIEW  COMPARISON:  11/08/2009  FINDINGS: The cardiac silhouette, mediastinal and hilar contours are within normal limits and stable. There are mild chronic bronchitic changes and bibasilar scarring. No infiltrates, edema or effusions. The bony thorax is intact.  IMPRESSION: Chronic lung changes but no acute pulmonary  findings.   Electronically Signed   By: Kalman Jewels [name]   On: 12/23/2014 21:40   TELE: SR  ECG: SR, no ischemic changes  Echo: 09/29/2014 Left ventricle: The cavity size was normal. Systolic function was normal. The estimated ejection fraction was in the range of 55% to 60%. Wall motion was normal; there were no regional wall motion abnormalities. Left ventricular diastolic function parameters were normal. - Aortic valve: Trileaflet; normal thickness leaflets. There was no regurgitation. - Aortic root: The aortic root was normal in size. - Mitral valve: Mildly thickened leaflets . There was trivial regurgitation. - Left atrium: The atrium was at the upper limits of normal in size. - Right ventricle: Systolic function was normal. - Right atrium: The atrium was normal in size. - Tricuspid valve: There was mild regurgitation. - Pulmonary arteries: Systolic pressure was within the normal range. - Inferior vena cava: The vessel was normal in size. - Pericardium,  extracardiac: There was no pericardial effusion.   ASSESSMENT AND PLAN  Lindsey Bishop is a 54 y.o. female with a history of paroxysmal afib who presented to the [facility] ED with AFib w RVR. She has been struggling with a URI for a couple of days, and got a dose of rocephin at clinic. Initially on presentation to [facility] ED she was hypotensive requiring fluid boluses and diltiazem gtt for RVR. Troponin also mildly elevated. I initially requested that she be admitted to [place] step down. However, while waiting for several hours for a bed, she converted back to sinus rhythm and feels much better. Denies any chest pain currently but does say she had some burning in her chest earlier while in AFib with RVR. Her main complaint currently is her URI symptoms (chills, lethargy, cough), and her WBC is 22. She has previously been seen by Dr. Rayann Heman for PAF management. She was not ready for ablation at that time, so has been managed medically.   1. Atrial fibrillation with RVR- maintains in SR. I would continue treatment for underlying URI ans start low dose BB - carvedilol 3.125 mg po BID. We will schedule an outpatient follow up with Dr Rayann Heman about further a-fib treatment like ablation/flecainide.  TSH normal.   2. Elevated troponin - Troponin 0.33-->0.36-->0.35, most probably sec to a-fib with RVR, I would schedule an outpatient stress test once she recovers from URI.   3.  AKI (acute kidney injury)-likely secondary to volume depletion and poor by mouth intake. INow resolved.  4. Obesity (BMI 30.0-34.9)-outpatient diabetic counseling  5.  URI with cough + Leukocytosis-chest x-ray shows no acute findings, she was started on azithromycin.   She can go home today, we will arrange for an outpatient stress test and follow up with Dr Rayann Heman.  Signed, Dorothy Spark MD, [facility] 12/25/2014

## 2014-12-25 NOTE — Progress Notes (Signed)
Patient discharged home with husband, discharge instructions given and explained to patient and she verbalized understanding, denies any pain/distress, No wound noted, skin intact. Accompanied home by husband, transported to the car by staff.

## 2015-01-02 ENCOUNTER — Telehealth: Payer: Self-pay | Admitting: Cardiology

## 2015-01-02 NOTE — Telephone Encounter (Signed)
New Message      Patient needs to know a little more information about her procedure on Thursday 01/05/15. Please give patient a call back.

## 2015-01-02 NOTE — Telephone Encounter (Signed)
Called patient back about her Myoview exercise stress test. Informed patient about what happen in the procedure and how to prepare. Patient given verbal instructions to hold her coreg the mornings off procedure, nothing to drink or eat four hours prior except for sips of water and no caffeine or chocolate 12 hours prior. Patient wanted to know about cost and insurance, informed patient that billing would receive a message to give her a call back.

## 2015-01-05 ENCOUNTER — Encounter (HOSPITAL_COMMUNITY): Payer: 59

## 2015-01-11 ENCOUNTER — Ambulatory Visit (HOSPITAL_COMMUNITY): Payer: 59 | Attending: Cardiovascular Disease | Admitting: Radiology

## 2015-01-11 DIAGNOSIS — I4891 Unspecified atrial fibrillation: Secondary | ICD-10-CM | POA: Diagnosis not present

## 2015-01-11 DIAGNOSIS — I248 Other forms of acute ischemic heart disease: Secondary | ICD-10-CM

## 2015-01-11 MED ORDER — REGADENOSON 0.4 MG/5ML IV SOLN
0.4000 mg | Freq: Once | INTRAVENOUS | Status: AC
  Administered 2015-01-11: 0.4 mg via INTRAVENOUS

## 2015-01-11 MED ORDER — TECHNETIUM TC 99M SESTAMIBI GENERIC - CARDIOLITE
10.0000 | Freq: Once | INTRAVENOUS | Status: AC | PRN
  Administered 2015-01-11: 10 via INTRAVENOUS

## 2015-01-11 MED ORDER — TECHNETIUM TC 99M SESTAMIBI GENERIC - CARDIOLITE
30.0000 | Freq: Once | INTRAVENOUS | Status: AC | PRN
  Administered 2015-01-11: 30 via INTRAVENOUS

## 2015-01-11 NOTE — Progress Notes (Signed)
[facility] [address] [place], [place] [address] [phone]    Cardiology Nuclear Med Study  Lindsey Bishop is a 54 y.o. female     MRN : [id number]     DOB: January 23, 1961  Procedure Date: 01/11/2015  Nuclear Med Background Indication for Stress Test:  Evaluation for Ischemia, and Patient seen in hospital on 12-25-2014 for Atrial Fibrillation with RVR, Troponin elevated secondary to demand ischemia History:  Afib with RVR Cardiac Risk Factors: None  Symptoms:  None indicated   Nuclear Pre-Procedure Caffeine/Decaff Intake:  None>12 hrs NPO After: 8:30pm   Lungs:  clear O2 Sat: 98% on room air. IV 0.9% NS with Angio Cath:  22g  IV Site: R Hand x 1, tolerated well IV Started by:  Irven Baltimore, RN  Chest Size (in):  38 Cup Size: C  Height: 5\' 11"  (1.803 m)  Weight:  203 lb (92.08 kg)  BMI:  Body mass index is 28.33 kg/(m^2). Tech Comments:  Patient took Coreg 3.125mg  po on arrival. Irven Baltimore, Therapist, sports.    Nuclear Med Study 1 or 2 day study: 1 day  Stress Test Type:  Treadmill/Lexiscan  Reading MD: N/A  Order Authorizing Provider:  Thompson Grayer, MD, and Murray Hodgkins, NP  Resting Radionuclide: Technetium 86m Sestamibi  Resting Radionuclide Dose: 11.0 mCi   Stress Radionuclide:  Technetium 61m Sestamibi  Stress Radionuclide Dose: 33.0 mCi           Stress Protocol Rest HR: 60 Stress HR: 117  Rest BP: 105/83 Stress BP: 111/92  Exercise Time (min): n/a METS: n/a   Predicted Max HR: 168 bpm % Max HR: 69.64 bpm Rate Pressure Product: 17199   Dose of Adenosine (mg):  n/a Dose of Lexiscan: 0.4 mg  Dose of Atropine (mg): n/a Dose of Dobutamine: n/a mcg/kg/min (at max HR)  Stress Test Technologist: Glade Lloyd, BS-ES  Nuclear Technologist:  Earl Many, CNMT     Rest Procedure:  Myocardial perfusion imaging was performed at rest 45 minutes following the intravenous administration of Technetium 63m Sestamibi. Rest ECG: NSR - Normal  EKG  Stress Procedure:  The patient received IV Lexiscan 0.4 mg over 15-seconds with concurrent low level exercise and then Technetium 61m Sestamibi was injected at 30-seconds while the patient continued walking one more minute.  Quantitative spect images were obtained after a 45-minute delay.  During the infusion of Lexiscan the patient complained of SOB that resolved in recovery.  Stress ECG: No significant change from baseline ECG  QPS Raw Data Images:  Normal; no motion artifact; normal heart/lung ratio. Stress Images:  Normal homogeneous uptake in all areas of the myocardium. Rest Images:  Normal homogeneous uptake in all areas of the myocardium. Subtraction (SDS):  No evidence of ischemia. Transient Ischemic Dilatation (Normal <1.22):  0.94 Lung/Heart Ratio (Normal <0.45):  0.35  Quantitative Gated Spect Images QGS EDV:  84 ml QGS ESV:  37 ml  Impression Exercise Capacity:  Lexiscan with low level exercise. BP Response:  Normal blood pressure response. Clinical Symptoms:  No significant symptoms noted. ECG Impression:  No significant ST segment change suggestive of ischemia. Comparison with Prior Nuclear Study: No images to compare  Overall Impression:  Normal stress nuclear study.   No evidence of ischemia.  Normal LV function.  LV Ejection Fraction: 56%.  LV Wall Motion:  NL LV Function; NL Wall Motion.   Thayer Headings, Brooke Bonito., MD, [facility] 01/11/2015, 5:18 PM [address]  [place] Pager [phone]

## 2015-03-18 IMAGING — CR DG CHEST 1V PORT
1 series · 1 of 1 positions shown · non-contrast
Comparison: 11/08/2009

CLINICAL DATA: Chills, cough and congestion for 4 days.

EXAM:
PORTABLE CHEST - 1 VIEW

[AP]
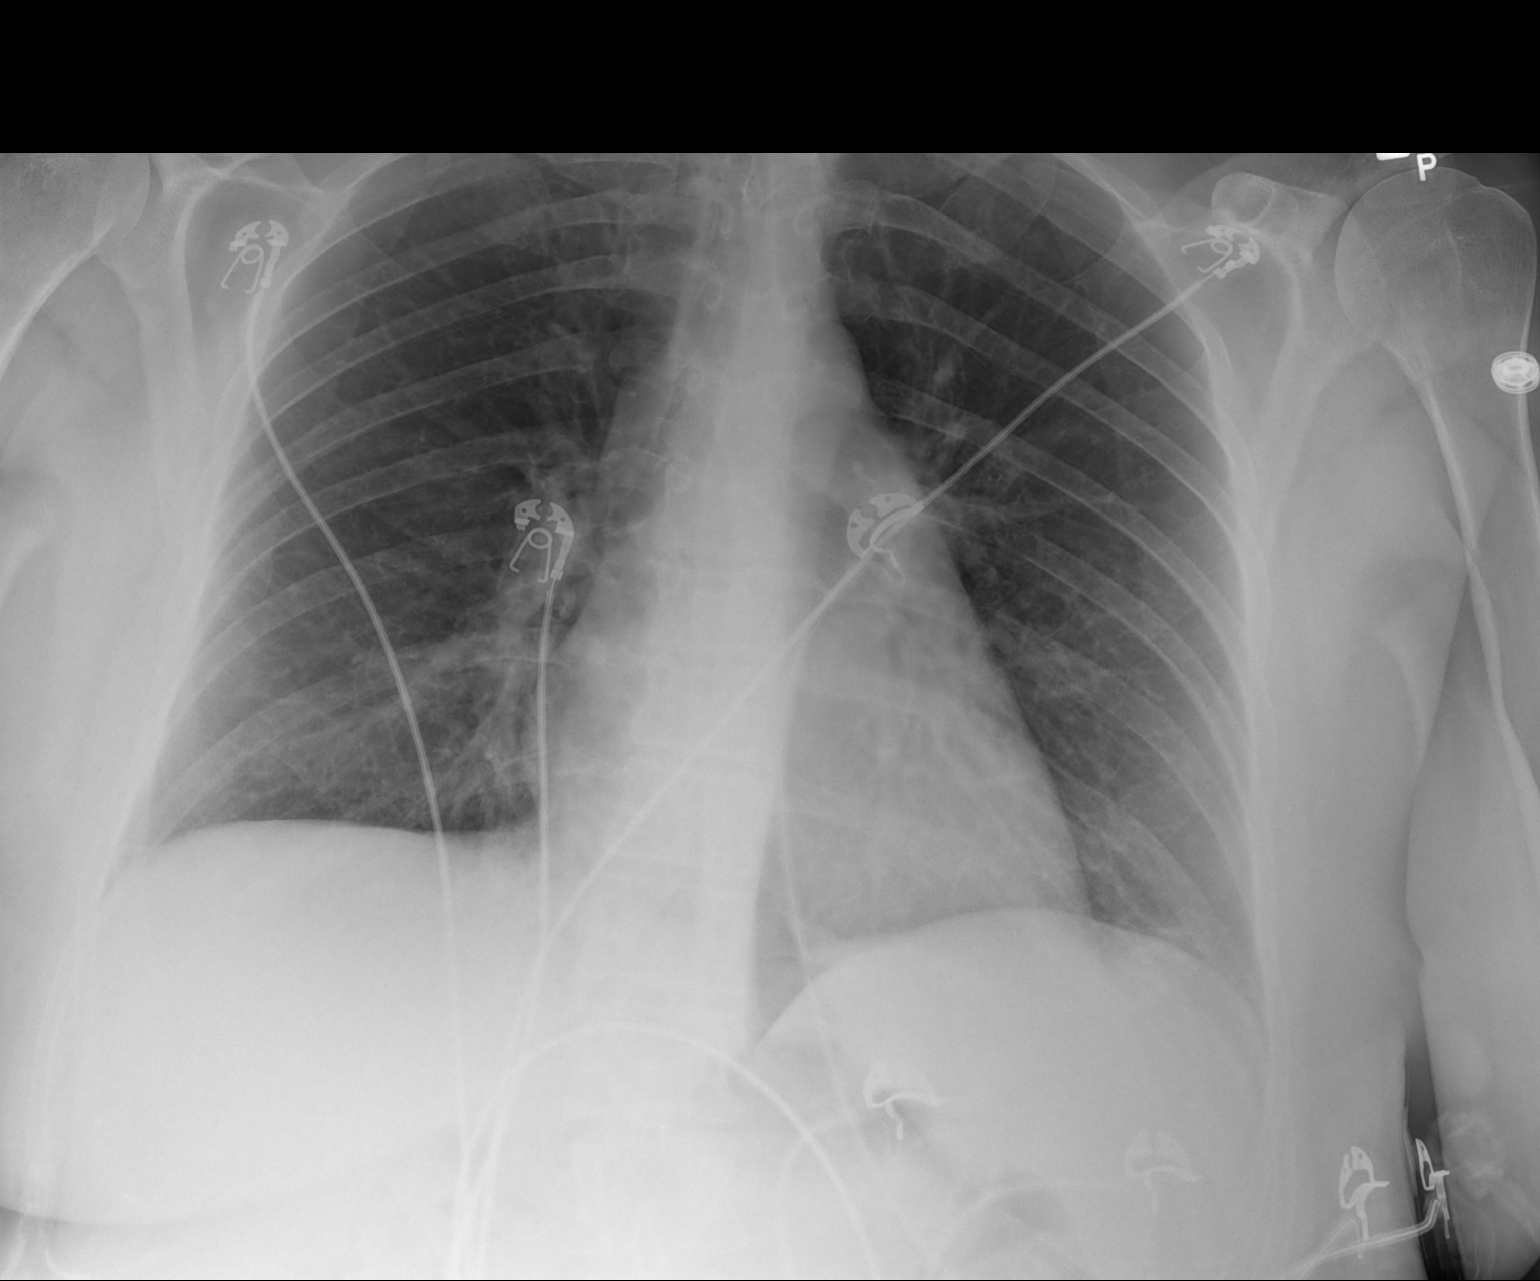

[1 of 1 positions shown; findings below may reference images not displayed]

FINDINGS: The cardiac silhouette, mediastinal and hilar contours are within
normal limits and stable. There are mild chronic bronchitic changes
and bibasilar scarring. No infiltrates, edema or effusions. The bony
thorax is intact.
IMPRESSION: Chronic lung changes but no acute pulmonary findings.

## 2015-06-13 ENCOUNTER — Telehealth: Payer: Self-pay | Admitting: Internal Medicine

## 2015-06-13 NOTE — Telephone Encounter (Signed)
Left pt a message to call back. 

## 2015-06-13 NOTE — Telephone Encounter (Signed)
New Message  Patient c/o Palpitations:  High priority if patient c/o lightheadedness and shortness of breath.  1. How long have you been having palpitations? Last night 7/26 around 11pm-- this month (July) around 4 episodes  2. Are you currently experiencing lightheadedness and shortness of breath? SoB after walking, sitting is ok  3. Have you checked your BP and heart rate? (document readings) n/a but feels her BP is different   4. Are you experiencing any other symptoms? no

## 2015-07-18 ENCOUNTER — Telehealth: Payer: Self-pay | Admitting: Internal Medicine

## 2015-07-18 NOTE — Telephone Encounter (Signed)
Melissa will call patient to schedule appointment as she was last seen in Dec

## 2015-07-18 NOTE — Telephone Encounter (Signed)
New problem    Pt want to speak to nurse concerning scheduling an ablation and need to know if she need an appt before it is scheduled. Please advise pt.

## 2015-07-18 NOTE — Telephone Encounter (Signed)
New problem. 

## 2015-07-26 ENCOUNTER — Encounter: Payer: Self-pay | Admitting: *Deleted

## 2015-07-26 ENCOUNTER — Ambulatory Visit (INDEPENDENT_AMBULATORY_CARE_PROVIDER_SITE_OTHER): Payer: 59 | Admitting: Internal Medicine

## 2015-07-26 ENCOUNTER — Encounter: Payer: Self-pay | Admitting: Internal Medicine

## 2015-07-26 VITALS — BP 120/72 | HR 73 | Ht 71.0 in | Wt 206.8 lb

## 2015-07-26 DIAGNOSIS — I480 Paroxysmal atrial fibrillation: Secondary | ICD-10-CM | POA: Diagnosis not present

## 2015-07-26 MED ORDER — RIVAROXABAN 20 MG PO TABS: 20.0000 mg | ORAL_TABLET | Freq: Every day | ORAL | Status: DC

## 2015-07-26 NOTE — Patient Instructions (Signed)
Medication Instructions:  Your physician has recommended you make the following change in your medication:  1) Stop Aspirin 2) Start Xarelto 20 mg daily   Labwork: Your physician recommends that you return for lab work on 08/29/15 at 7:30am.  You do not have to fasst   Testing/Procedures: Your physician has recommended that you have an ablation. Catheter ablation is a medical procedure used to treat some cardiac arrhythmias (irregular heartbeats). During catheter ablation, a long, thin, flexible tube is put into a blood vessel in your groin (upper thigh), or neck. This tube is called an ablation catheter. It is then guided to your heart through the blood vessel. Radio frequency waves destroy small areas of heart tissue where abnormal heartbeats may cause an arrhythmia to start. Please see the instruction sheet given to you today.    Follow-Up: Your physician recommends that you schedule a follow-up appointment 4 weeks from 09/05/15 with Roderic Palau, NP and 3 months from 09/05/15 with Dr Rayann Heman   Any Other Special Instructions Will Be Listed Below (If Applicable).
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
## 2015-08-29 ENCOUNTER — Other Ambulatory Visit: Payer: Commercial Managed Care - HMO | Admitting: *Deleted

## 2015-08-29 DIAGNOSIS — I480 Paroxysmal atrial fibrillation: Secondary | ICD-10-CM

## 2015-08-29 LAB — CBC WITH DIFFERENTIAL/PLATELET
Basophils Absolute: 0.1 10*3/uL (ref 0.0–0.1)
Basophils Relative: 1 % (ref 0–1)
EOS ABS: 0.2 10*3/uL (ref 0.0–0.7)
Eosinophils Relative: 3 % (ref 0–5)
HCT: 43.5 % (ref 36.0–46.0)
Hemoglobin: 14.4 g/dL (ref 12.0–15.0)
LYMPHS ABS: 1.8 10*3/uL (ref 0.7–4.0)
Lymphocytes Relative: 31 % (ref 12–46)
MCH: 29.6 pg (ref 26.0–34.0)
MCHC: 33.1 g/dL (ref 30.0–36.0)
MCV: 89.5 fL (ref 78.0–100.0)
MONOS PCT: 9 % (ref 3–12)
MPV: 9.9 fL (ref 8.6–12.4)
Monocytes Absolute: 0.5 10*3/uL (ref 0.1–1.0)
Neutro Abs: 3.2 10*3/uL (ref 1.7–7.7)
Neutrophils Relative %: 56 % (ref 43–77)
Platelets: 253 10*3/uL (ref 150–400)
RBC: 4.86 MIL/uL (ref 3.87–5.11)
RDW: 13.8 % (ref 11.5–15.5)
WBC: 5.8 10*3/uL (ref 4.0–10.5)

## 2015-08-29 LAB — BASIC METABOLIC PANEL
BUN: 16 mg/dL (ref 7–25)
CALCIUM: 9.9 mg/dL (ref 8.6–10.4)
CHLORIDE: 108 mmol/L (ref 98–110)
CO2: 24 mmol/L (ref 20–31)
Creat: 0.87 mg/dL (ref 0.50–1.05)
Glucose, Bld: 96 mg/dL (ref 65–99)
Potassium: 5 mmol/L (ref 3.5–5.3)
Sodium: 140 mmol/L (ref 135–146)

## 2015-09-04 ENCOUNTER — Ambulatory Visit (HOSPITAL_BASED_OUTPATIENT_CLINIC_OR_DEPARTMENT_OTHER)
Admission: RE | Admit: 2015-09-04 | Discharge: 2015-09-04 | Disposition: A | Discharge status: Home / Self Care | Payer: Commercial Managed Care - HMO | Source: Ambulatory Visit | Attending: Cardiovascular Disease | Admitting: Cardiovascular Disease

## 2015-09-04 ENCOUNTER — Ambulatory Visit (HOSPITAL_COMMUNITY)
Admission: RE | Admit: 2015-09-04 | Discharge: 2015-09-04 | Disposition: A | Discharge status: Home / Self Care | Payer: Commercial Managed Care - HMO | Source: Ambulatory Visit | Attending: Cardiovascular Disease | Admitting: Cardiovascular Disease

## 2015-09-04 ENCOUNTER — Encounter (HOSPITAL_COMMUNITY): Payer: Self-pay

## 2015-09-04 ENCOUNTER — Encounter (HOSPITAL_COMMUNITY)
Admission: RE | Disposition: A | Discharge status: Home / Self Care | Payer: Self-pay | Source: Ambulatory Visit | Attending: Cardiovascular Disease

## 2015-09-04 DIAGNOSIS — I480 Paroxysmal atrial fibrillation: Secondary | ICD-10-CM | POA: Insufficient documentation

## 2015-09-04 DIAGNOSIS — Z7901 Long term (current) use of anticoagulants: Secondary | ICD-10-CM | POA: Insufficient documentation

## 2015-09-04 DIAGNOSIS — Z7951 Long term (current) use of inhaled steroids: Secondary | ICD-10-CM | POA: Diagnosis not present

## 2015-09-04 HISTORY — PX: TEE WITHOUT CARDIOVERSION: SHX5443

## 2015-09-04 SURGERY — ECHOCARDIOGRAM, TRANSESOPHAGEAL
Anesthesia: Moderate Sedation

## 2015-09-04 MED ORDER — MIDAZOLAM HCL 10 MG/2ML IJ SOLN
INTRAMUSCULAR | Status: DC | PRN
Start: 2015-09-04 — End: 2015-09-04
  Administered 2015-09-04: 2 mg via INTRAVENOUS
  Administered 2015-09-04: 1 mg via INTRAVENOUS
  Administered 2015-09-04 (×3): 2 mg via INTRAVENOUS

## 2015-09-04 MED ORDER — FENTANYL CITRATE (PF) 100 MCG/2ML IJ SOLN
INTRAMUSCULAR | Status: DC | PRN
  Administered 2015-09-04 (×4): 25 ug via INTRAVENOUS

## 2015-09-04 MED ORDER — FENTANYL CITRATE (PF) 100 MCG/2ML IJ SOLN
INTRAMUSCULAR | Status: AC
  Filled 2015-09-04: qty 2

## 2015-09-04 MED ORDER — BUTAMBEN-TETRACAINE-BENZOCAINE 2-2-14 % EX AERO
INHALATION_SPRAY | CUTANEOUS | Status: DC | PRN
  Administered 2015-09-04: 2 via TOPICAL

## 2015-09-04 MED ORDER — MIDAZOLAM HCL 5 MG/ML IJ SOLN
INTRAMUSCULAR | Status: AC
  Filled 2015-09-04: qty 2
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
## 2015-09-04 NOTE — Progress Notes (Signed)
  Echocardiogram Echocardiogram Transesophageal has been performed.  Lindsey Bishop 09/04/2015, 10:50 AM

## 2015-09-04 NOTE — Brief Op Note (Signed)
09/04/2015  9:56 AM  PATIENT:  Lindsey Bishop  54 y.o. female  PRE-OPERATIVE DIAGNOSIS:  aFIB  POST-OPERATIVE DIAGNOSIS:  trace MR and TR, no thrombus  PROCEDURE:  Procedure(s): TRANSESOPHAGEAL ECHOCARDIOGRAM (TEE) (N/A)  SURGEON:  Surgeon(s) and Role:    * Skeet Latch, MD - Primary  PHYSICIAN ASSISTANT:   ASSISTANTS: none   ANESTHESIA:   local and IV sedation  EBL:     BLOOD ADMINISTERED:none  DRAINS: none   LOCAL MEDICATIONS USED:  LIDOCAINE   SPECIMEN:  No Specimen  DISPOSITION OF SPECIMEN:  N/A  COUNTS:  YES  TOURNIQUET:  * No tourniquets in log *  DICTATION: .Note written in EPIC  PLAN OF CARE: Discharge to home after PACU  PATIENT DISPOSITION:  PACU - hemodynamically stable.   Delay start of Pharmacological VTE agent (>24hrs) due to surgical blood loss or risk of bleeding: no

## 2015-09-04 NOTE — Discharge Instructions (Signed)

## 2015-09-05 ENCOUNTER — Encounter (HOSPITAL_COMMUNITY): Payer: Self-pay | Admitting: Anesthesiology

## 2015-09-05 ENCOUNTER — Ambulatory Visit (HOSPITAL_COMMUNITY)
Admission: RE | Admit: 2015-09-05 | Discharge: 2015-09-06 | Disposition: A | Discharge status: Home / Self Care | Payer: Commercial Managed Care - HMO | Source: Ambulatory Visit | Attending: Internal Medicine | Admitting: Internal Medicine

## 2015-09-05 ENCOUNTER — Encounter (HOSPITAL_COMMUNITY)
Admission: RE | Disposition: A | Discharge status: Home / Self Care | Payer: Self-pay | Source: Ambulatory Visit | Attending: Internal Medicine

## 2015-09-05 ENCOUNTER — Ambulatory Visit (HOSPITAL_COMMUNITY): Payer: Commercial Managed Care - HMO | Admitting: Anesthesiology

## 2015-09-05 DIAGNOSIS — I480 Paroxysmal atrial fibrillation: Secondary | ICD-10-CM | POA: Insufficient documentation

## 2015-09-05 DIAGNOSIS — Z7901 Long term (current) use of anticoagulants: Secondary | ICD-10-CM | POA: Insufficient documentation

## 2015-09-05 DIAGNOSIS — I4891 Unspecified atrial fibrillation: Secondary | ICD-10-CM | POA: Diagnosis present

## 2015-09-05 DIAGNOSIS — I471 Supraventricular tachycardia: Secondary | ICD-10-CM | POA: Diagnosis not present

## 2015-09-05 HISTORY — PX: ELECTROPHYSIOLOGIC STUDY: SHX172A

## 2015-09-05 LAB — BASIC METABOLIC PANEL
ANION GAP: 3 — AB (ref 5–15)
BUN: 6 mg/dL (ref 6–20)
CALCIUM: 8.5 mg/dL — AB (ref 8.9–10.3)
CO2: 24 mmol/L (ref 22–32)
Chloride: 114 mmol/L — ABNORMAL HIGH (ref 101–111)
Creatinine, Ser: 0.78 mg/dL (ref 0.44–1.00)
GFR calc Af Amer: >60 mL/min (ref >60)
Glucose, Bld: 111 mg/dL — ABNORMAL HIGH (ref 65–99)
POTASSIUM: 4 mmol/L (ref 3.5–5.1)
SODIUM: 141 mmol/L (ref 135–145)

## 2015-09-05 LAB — POCT ACTIVATED CLOTTING TIME
ACTIVATED CLOTTING TIME: 159 s
Activated Clotting Time: 319 seconds
Activated Clotting Time: 288 seconds
Activated Clotting Time: 300 seconds
Activated Clotting Time: 251 seconds

## 2015-09-05 LAB — MRSA PCR SCREENING: MRSA BY PCR: NEGATIVE

## 2015-09-05 SURGERY — ATRIAL FIBRILLATION ABLATION
Anesthesia: General

## 2015-09-05 MED ORDER — DEXTROSE 5 % IV SOLN
2.0000 ug/min | INTRAVENOUS | Status: DC
  Filled 2015-09-05: qty 5

## 2015-09-05 MED ORDER — RIVAROXABAN 20 MG PO TABS
20.0000 mg | ORAL_TABLET | Freq: Every day | ORAL | Status: DC
  Administered 2015-09-05: 20 mg via ORAL
  Filled 2015-09-05: qty 1

## 2015-09-05 MED ORDER — PHENYLEPHRINE HCL 10 MG/ML IJ SOLN
10.0000 mg | INTRAVENOUS | Status: DC | PRN
  Administered 2015-09-05: 10 ug/min via INTRAVENOUS

## 2015-09-05 MED ORDER — BUPIVACAINE HCL (PF) 0.25 % IJ SOLN
INTRAMUSCULAR | Status: DC | PRN
  Administered 2015-09-05: 25 mL

## 2015-09-05 MED ORDER — ISOPROTERENOL HCL 0.2 MG/ML IJ SOLN
1000.0000 ug | INTRAVENOUS | Status: DC | PRN
  Administered 2015-09-05: 10 ug/min via INTRAVENOUS

## 2015-09-05 MED ORDER — FENTANYL CITRATE (PF) 100 MCG/2ML IJ SOLN: 25.0000 ug | INTRAMUSCULAR | Status: DC | PRN

## 2015-09-05 MED ORDER — SODIUM CHLORIDE 0.9 % IV SOLN
INTRAVENOUS | Status: DC
  Administered 2015-09-05 (×3): via INTRAVENOUS

## 2015-09-05 MED ORDER — SODIUM CHLORIDE 0.9 % IV SOLN: 250.0000 mL | INTRAVENOUS | Status: DC | PRN

## 2015-09-05 MED ORDER — SODIUM CHLORIDE 0.9 % IJ SOLN: 3.0000 mL | INTRAMUSCULAR | Status: DC | PRN

## 2015-09-05 MED ORDER — BUPIVACAINE HCL (PF) 0.25 % IJ SOLN
INTRAMUSCULAR | Status: AC
  Filled 2015-09-05: qty 30

## 2015-09-05 MED ORDER — ONDANSETRON HCL 4 MG/2ML IJ SOLN
INTRAMUSCULAR | Status: DC | PRN
  Administered 2015-09-05: 4 mg via INTRAVENOUS

## 2015-09-05 MED ORDER — EPHEDRINE SULFATE 50 MG/ML IJ SOLN
INTRAMUSCULAR | Status: DC | PRN
  Administered 2015-09-05 (×2): 5 mg via INTRAVENOUS

## 2015-09-05 MED ORDER — FENTANYL CITRATE (PF) 100 MCG/2ML IJ SOLN: INTRAMUSCULAR | Status: DC | PRN

## 2015-09-05 MED ORDER — SODIUM CHLORIDE 0.9 % IJ SOLN
3.0000 mL | Freq: Two times a day (BID) | INTRAMUSCULAR | Status: DC
  Administered 2015-09-05 (×2): 3 mL via INTRAVENOUS

## 2015-09-05 MED ORDER — LIDOCAINE HCL (CARDIAC) 20 MG/ML IV SOLN
INTRAVENOUS | Status: DC | PRN
  Administered 2015-09-05: 60 mg via INTRAVENOUS

## 2015-09-05 MED ORDER — IBUPROFEN 200 MG PO TABS
400.0000 mg | ORAL_TABLET | Freq: Four times a day (QID) | ORAL | Status: DC | PRN
  Administered 2015-09-05: 400 mg via ORAL
  Filled 2015-09-05: qty 2

## 2015-09-05 MED ORDER — MIDAZOLAM HCL 5 MG/5ML IJ SOLN
INTRAMUSCULAR | Status: DC | PRN
  Administered 2015-09-05: 2 mg via INTRAVENOUS

## 2015-09-05 MED ORDER — HEPARIN SODIUM (PORCINE) 1000 UNIT/ML IJ SOLN
INTRAMUSCULAR | Status: AC
  Filled 2015-09-05: qty 1

## 2015-09-05 MED ORDER — SODIUM CHLORIDE 0.9 % IV SOLN
2.0000 ug/min | INTRAVENOUS | Status: DC
  Filled 2015-09-05: qty 2

## 2015-09-05 MED ORDER — ONDANSETRON HCL 4 MG/2ML IJ SOLN: 4.0000 mg | Freq: Four times a day (QID) | INTRAMUSCULAR | Status: DC | PRN

## 2015-09-05 MED ORDER — IOHEXOL 350 MG/ML SOLN
INTRAVENOUS | Status: DC | PRN
  Administered 2015-09-05: 102 mL via INTRACARDIAC

## 2015-09-05 MED ORDER — PROTAMINE SULFATE 10 MG/ML IV SOLN
INTRAVENOUS | Status: DC | PRN
  Administered 2015-09-05: 30 mg via INTRAVENOUS

## 2015-09-05 MED ORDER — PROPOFOL 10 MG/ML IV BOLUS
INTRAVENOUS | Status: DC | PRN
  Administered 2015-09-05: 160 mg via INTRAVENOUS

## 2015-09-05 MED ORDER — HEPARIN SODIUM (PORCINE) 1000 UNIT/ML IJ SOLN
INTRAMUSCULAR | Status: DC | PRN
  Administered 2015-09-05: 12000 [IU] via INTRAVENOUS
  Administered 2015-09-05: 2000 [IU] via INTRAVENOUS
  Administered 2015-09-05: 1000 [IU] via INTRAVENOUS
  Administered 2015-09-05: 2000 [IU] via INTRAVENOUS

## 2015-09-05 MED ORDER — FENTANYL CITRATE (PF) 100 MCG/2ML IJ SOLN
INTRAMUSCULAR | Status: DC | PRN
  Administered 2015-09-05 (×4): 25 ug via INTRAVENOUS
  Administered 2015-09-05: 50 ug via INTRAVENOUS

## 2015-09-05 SURGICAL SUPPLY — 23 items
BAG SNAP BAND KOVER 36X36 (MISCELLANEOUS) ×2 IMPLANT
BLANKET WARM UNDERBOD FULL ACC (MISCELLANEOUS) ×2 IMPLANT
CATH DIAG 6FR PIGTAIL (CATHETERS) ×2 IMPLANT
CATH JOSEPHSON QUAD-ALLRED 6FR (CATHETERS) ×1 IMPLANT
CATH NAVISTAR SMARTTOUCH DF (ABLATOR) ×2 IMPLANT
CATH SOUNDSTAR ECO REPROCESSED (CATHETERS) ×1 IMPLANT
CATH VARIABLE LASSO NAV 2515 (CATHETERS) ×1 IMPLANT
CATH WEBSTER BI DIR CS D-F CRV (CATHETERS) ×2 IMPLANT
COVER SWIFTLINK CONNECTOR (BAG) ×2 IMPLANT
NDL TRANSEP BRK 71CM 407200 (NEEDLE) ×1 IMPLANT
NEEDLE TRANSEP BRK 71CM 407200 (NEEDLE) ×2 IMPLANT
PACK EP LATEX FREE (CUSTOM PROCEDURE TRAY) ×2
PACK EP LF (CUSTOM PROCEDURE TRAY) ×1 IMPLANT
PAD DEFIB LIFELINK (PAD) ×2 IMPLANT
PATCH CARTO3 (PAD) ×2 IMPLANT
SHEATH AVANTI 11F 11CM (SHEATH) ×2 IMPLANT
SHEATH PINNACLE 7F 10CM (SHEATH) ×4 IMPLANT
SHEATH PINNACLE 9F 10CM (SHEATH) ×2 IMPLANT
SHEATH SWARTZ TS SL2 63CM 8.5F (SHEATH) ×2 IMPLANT
SHIELD RADPAD SCOOP 12X17 (MISCELLANEOUS) ×2 IMPLANT
SYR MEDRAD MARK V 150ML (SYRINGE) ×2 IMPLANT
TUBING CONTRAST HIGH PRESS 48 (TUBING) ×2 IMPLANT
TUBING SMART ABLATE COOLFLOW (TUBING) ×2 IMPLANT

## 2015-09-05 NOTE — Anesthesia Procedure Notes (Signed)
Procedure Name: LMA Insertion Date/Time: 09/05/2015 7:51 AM Performed by: Neldon Newport Pre-anesthesia Checklist: Patient identified, Emergency Drugs available, Suction available, Patient being monitored and Timeout performed Patient Re-evaluated:Patient Re-evaluated prior to inductionOxygen Delivery Method: Circle system utilized Preoxygenation: Pre-oxygenation with 100% oxygen Intubation Type: IV induction LMA: LMA inserted LMA Size: 4.0 Number of attempts: 1 Placement Confirmation: positive ETCO2,  CO2 detector and breath sounds checked- equal and bilateral Tube secured with: Tape Dental Injury: Teeth and Oropharynx as per pre-operative assessment

## 2015-09-05 NOTE — Anesthesia Postprocedure Evaluation (Signed)
  Anesthesia Post-op Note  Patient: Lindsey Bishop  Procedure(s) Performed: Procedure(s): Atrial Fibrillation Ablation (N/A)  Patient Location: PACU  Anesthesia Type:General  Level of Consciousness: awake and alert   Airway and Oxygen Therapy: Patient Spontanous Breathing  Post-op Pain: Controlled  Post-op Assessment: Post-op Vital signs reviewed, Patient's Cardiovascular Status Stable and Respiratory Function Stable  Post-op Vital Signs: Reviewed  Filed Vitals:   09/05/15 1200  BP: 115/82  Pulse: 81  Temp:   Resp: 13    Complications: No apparent anesthesia complications

## 2015-09-05 NOTE — Discharge Summary (Signed)
ELECTROPHYSIOLOGY PROCEDURE DISCHARGE SUMMARY    Patient ID: Lindsey Bishop,  MRN: [id number], DOB/AGE: 1961/02/15 54 y.o.  Admit date: 09/05/2015 Discharge date: 09/06/2015  Primary Care Physician: No PCP Per Patient Primary Cardiologist: Marlou Porch Electrophysiologist: Thompson Grayer, MD  Primary Discharge Diagnosis:  Paroxysmal atrial fibrillation and atrial tachycardia status post ablation this admission  Secondary Discharge Diagnosis:  1.  Previous drug induced hepatitis on Multaq  Procedures This Admission:  1.  Electrophysiology study and radiofrequency catheter ablation on 09-05-15 by Dr Thompson Grayer.  This study demonstrated sinus rhythm upon presentation; rotational Angiography reveals a moderate sized left atrium with four separate pulmonary veins without evidence of pulmonary vein stenosis; successful electrical isolation and anatomical encircling of all four pulmonary veins with radiofrequency current; the patient had documented ORT over a L posteroseptal accessory pathway with CL 360 msec; successful ablation of a left posteroseptal accessory pathway; no evidence of dual AV nodal physiology; no inducible arrhythmias post ablation; no early apparent complications.  Brief HPI: Lindsey Bishop is a 54 y.o. female with a history of paroxysmal atrial fibrillation.  They have failed medical therapy with Multaq. Risks, benefits, and alternatives to catheter ablation of atrial fibrillation were reviewed with the patient who wished to proceed.  The patient underwent TEE prior to the procedure which demonstrated normal LV function and no LAA thrombus.    Hospital Course:  The patient was admitted and underwent EPS/RFCA of atrial fibrillation with details as outlined above.  They were monitored on telemetry overnight which demonstrated sinus rhythm.  Groin was without complication on the day of discharge.  The patient was examined and considered to be stable for discharge.  Wound  care and restrictions were reviewed with the patient.  The patient will be seen back by Dr Rayann Heman in 12 weeks for post ablation follow up.   This patients CHA2DS2-VASc Score and unadjusted Ischemic Stroke Rate (% per year) is equal to 0.6 % stroke rate/year from a score of 1 Above score calculated as 1 point each if present [CHF, HTN, DM, Vascular=MI/PAD/Aortic Plaque, Age if 65-74, or Female] Above score calculated as 2 points each if present [Age > 75, or Stroke/TIA/TE]   Physical Exam: Filed Vitals:   09/05/15 1900 09/05/15 1950 09/05/15 2330 09/06/15 0350  BP: 125/74 122/78 120/75 110/63  Pulse:      Temp:  98.8 F (37.1 C) 98.1 F (36.7 C) 97.9 F (36.6 C)  TempSrc:  Oral Oral Oral  Resp: 16 18 18    Height:      Weight:      SpO2:   97% 94%    GEN- The patient is well appearing, alert and oriented x 3 today.   HEENT: normocephalic, atraumatic; sclera clear, conjunctiva pink; hearing intact; oropharynx clear; neck supple  Lungs- Clear to ausculation bilaterally, normal work of breathing.  No wheezes, rales, rhonchi Heart- Regular rate and rhythm, no murmurs, rubs or gallops  GI- soft, non-tender, non-distended, bowel sounds present  Extremities- no clubbing, cyanosis, or edema; DP/PT/radial pulses 2+ bilaterally, groin without hematoma/bruit MS- no significant deformity or atrophy Skin- warm and dry, no rash or lesion Psych- euthymic mood, full affect Neuro- strength and sensation are intact   Labs:   Lab Results  Component Value Date   WBC 5.8 08/29/2015   HGB 14.4 08/29/2015   HCT 43.5 08/29/2015   MCV 89.5 08/29/2015   PLT 253 08/29/2015     Recent Labs Lab 09/05/15 1605  NA 141  K 4.0  CL 114*  CO2 24  BUN 6  CREATININE 0.78  CALCIUM 8.5*  GLUCOSE 111*     Discharge Medications:    Medication List    TAKE these medications        metoprolol tartrate 25 MG tablet  Commonly known as:  LOPRESSOR  Take 25 mg by mouth daily as needed (for afib).       pantoprazole 40 MG tablet  Commonly known as:  PROTONIX  Take 1 tablet (40 mg total) by mouth daily. Discontinue after 6 weeks     rivaroxaban 20 MG Tabs tablet  Commonly known as:  XARELTO  Take 1 tablet (20 mg total) by mouth daily with supper.        Disposition:  Discharge Instructions    Diet - low sodium heart healthy    Complete by:  As directed      Increase activity slowly    Complete by:  As directed           Follow-up Information    Follow up with [facility] On 10/05/2015.   Specialty:  Cardiology   Why:  at 8:30AM   Contact information:   [address] [id number] [place] [phone]      Follow up with Thompson Grayer, MD On 11/22/2015.   Specialty:  Cardiology   Why:  at 8:45AM   Contact information:   [place] [address] [phone]       Duration of Discharge Encounter: Greater than 30 minutes including physician time.  Signed, Chanetta Marshall, NP 09/06/2015 7:06 AM     I have seen, examined the patient, and reviewed the above assessment and plan. On exam rrr.  Changes to above are made where necessary.    Co Sign: Thompson Grayer, MD 09/06/2015 9:45 PM

## 2015-09-05 NOTE — Anesthesia Preprocedure Evaluation (Addendum)
Anesthesia Evaluation  Patient identified by MRN, date of birth, ID band Patient awake    Reviewed: Allergy & Precautions, H&P , NPO status , Patient's Chart, lab work & pertinent test results, reviewed documented beta blocker date and time   Airway Mallampati: II  TM Distance: >3 FB Neck ROM: Full    Dental no notable dental hx. (+) Teeth Intact, Dental Advidsory Given   Pulmonary neg pulmonary ROS,    Pulmonary exam normal breath sounds clear to auscultation       Cardiovascular On Home Beta Blockers + dysrhythmias Atrial Fibrillation  Rhythm:Irregular Rate:Normal     Neuro/Psych negative neurological ROS  negative psych ROS   GI/Hepatic negative GI ROS, Neg liver ROS,   Endo/Other  negative endocrine ROS  Renal/GU negative Renal ROS  negative genitourinary   Musculoskeletal   Abdominal   Peds  Hematology negative hematology ROS (+)   Anesthesia Other Findings   Reproductive/Obstetrics negative OB ROS                           Anesthesia Physical Anesthesia Plan  ASA: III  Anesthesia Plan: General   Post-op Pain Management:    Induction: Intravenous  Airway Management Planned: LMA  Additional Equipment:   Intra-op Plan:   Post-operative Plan: Extubation in OR  Informed Consent: I have reviewed the patients History and Physical, chart, labs and discussed the procedure including the risks, benefits and alternatives for the proposed anesthesia with the patient or authorized representative who has indicated his/her understanding and acceptance.   Dental advisory given and Dental Advisory Given  Plan Discussed with: CRNA, Anesthesiologist and Surgeon  Anesthesia Plan Comments:        Anesthesia Quick Evaluation

## 2015-09-05 NOTE — Transfer of Care (Signed)
Immediate Anesthesia Transfer of Care Note  Patient: Lindsey Bishop  Procedure(s) Performed: Procedure(s): Atrial Fibrillation Ablation (N/A)  Patient Location: PACU  Anesthesia Type:General  Level of Consciousness: awake, alert  and oriented  Airway & Oxygen Therapy: Patient Spontanous Breathing and Patient connected to nasal cannula oxygen  Post-op Assessment: Report given to RN, Post -op Vital signs reviewed and stable and Patient moving all extremities X 4  Post vital signs: Reviewed and stable  Last Vitals:  Filed Vitals:   09/05/15 0554  BP: 127/97  Pulse: 68  Temp: 36.4 C  Resp: 18    Complications: No apparent anesthesia complications
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
## 2015-09-05 NOTE — Progress Notes (Signed)
Site area: RFV x 3 Site Prior to Removal:  Level  0 Pressure Applied For:20 min Manual:yes    Patient Status During Pull:  stable Post Pull Site:  Level 0 Post Pull Instructions Given: yes  Post Pull Pulses Present: palpable Dressing Applied:  clear Bedrest begins @ 9093 till [address] Comments:

## 2015-09-06 DIAGNOSIS — I480 Paroxysmal atrial fibrillation: Secondary | ICD-10-CM | POA: Diagnosis not present

## 2015-09-06 DIAGNOSIS — Z7901 Long term (current) use of anticoagulants: Secondary | ICD-10-CM | POA: Diagnosis not present

## 2015-09-06 MED ORDER — PANTOPRAZOLE SODIUM 40 MG PO TBEC: 40.0000 mg | DELAYED_RELEASE_TABLET | Freq: Every day | ORAL | Status: DC

## 2015-09-06 NOTE — Discharge Instructions (Signed)
No driving for 4 days. No lifting over 5 lbs for 1 week. No sexual activity for 1 week. You may return to work in 1 week. Keep procedure site clean & dry. If you notice increased pain, swelling, bleeding or pus, call/return!  You may shower, but no soaking baths/hot tubs/pools for 1 week.  ° ° °You have an appointment set up with the Atrial Fibrillation Clinic.  Multiple studies have shown that being followed by a dedicated atrial fibrillation clinic in addition to the standard care you receive from your other physicians improves health. We believe that enrollment in the atrial fibrillation clinic will allow us to better care for you.  ° °The phone number to the Atrial Fibrillation Clinic is [phone]. The clinic is staffed Monday through Friday from 8:30am to 5pm. ° °Parking Directions: The clinic is located in the Heart and Vascular Building connected to [facility]. °1)From [address] turn on to [address] and go to the 3rd entrance  (Heart and Vascular entrance) on the right. °2)Look to the right for Heart &Vascular Parking Garage. °3)A code for the entrance is required please call the clinic to receive this.   °4)Take the elevators to the 1st floor. Registration is in the room with the glass walls at the end of the hallway. ° °If you have any trouble parking or locating the clinic, please don’t hesitate to call [phone]. ° ° °

## 2015-09-06 NOTE — Progress Notes (Signed)
Patient discharged per orders. All d/c instructions/teaching discussed including follow up appts, medications, prescriptions, home care, signs and symptoms of infection in groin area. Time allowed for questions/concerns. Patient stated she had none. Patient able to dress self, pack up room, ambulate independently. Patient denied pain or discomfort. Patient d/c'd via wheelchair accompanied by tech and patient's husband.  Roselyn Reef Kohana Amble,RN

## 2015-09-08 ENCOUNTER — Telehealth: Payer: Self-pay | Admitting: Internal Medicine

## 2015-09-08 NOTE — Telephone Encounter (Signed)
New Message      Pt calling stating that she had an Ablation on Tuesday and last night up until now she is back in A-fib and she has taken the Metoporol and it still isn't helping. What should she do? Please call back and advise.

## 2015-09-08 NOTE — Telephone Encounter (Signed)
Calling stating she had an ablation on Tues 10/18 and felt good till last PM.  States about 11:00 pm went back into Afib. HR was fast and hard.  States she took a Metoprolol 25 mg and then took another one 30 min later because didn't calm down.  After 2nd dose of Metoprolol seemed to calm down some but did remain in Afib all night.  This AM was still in Afib so took another Metoprolol 25 mg at 9:15.  States she is lying down and trying to stay calm.  States she is under some stress because her HR is giving her a hard time regarding her leave. She understands that she may go in and out of Afib for up to 3 months post ablation. Advised will speak to [facility] Dunn,PA/flex and call her back.  Spoke w/Dayna who suggested she take Metoprolol every 6 hrs today and then if continued to have Afib will speak with EP on Monday or have seen in Afib clinic. Advised her to call us later today if she was still concerned about the Afib. She verbalizes understanding and will take the Metoprolol every 6 hrs today.

## 2015-09-11 ENCOUNTER — Telehealth: Payer: Self-pay | Admitting: Internal Medicine

## 2015-09-11 NOTE — Telephone Encounter (Signed)
Called patient and left her a message that I would forward this to afib clinic and have them call her to possibly move appointment up

## 2015-09-11 NOTE — Telephone Encounter (Signed)
New Message  Patient c/o Afib:    1. How long have you been having palpitations? Since the ablation on th 18th of October. Per pt she understands that she could have occurences after the ablation but states that they are continuous. Pt reports that she has had one episode everyday since Thursday. Would like a call back to discuss if there is some type of anti arrhthymatics or if she will have to tuff it out. Also would like Dr. Rayann Heman to extend her leave from work until 09/18/2015.  2. Are you currently experiencing lightheadedness and shortness of breath? Per pt this is when the Afib kicks in and her BP changes.   3. Have you checked your BP and heart rate? (document readings) No   4. Are you experiencing any other symptoms? "Just the annoying Afib" per pt.

## 2015-09-12 ENCOUNTER — Ambulatory Visit (HOSPITAL_COMMUNITY)
Admission: RE | Admit: 2015-09-12 | Discharge: 2015-09-12 | Disposition: A | Discharge status: Home / Self Care | Payer: Commercial Managed Care - HMO | Source: Ambulatory Visit | Attending: Nurse Practitioner | Admitting: Nurse Practitioner

## 2015-09-12 ENCOUNTER — Encounter (HOSPITAL_COMMUNITY): Payer: Self-pay | Admitting: Nurse Practitioner

## 2015-09-12 VITALS — BP 114/90 | HR 82 | Ht 71.0 in | Wt 206.8 lb

## 2015-09-12 DIAGNOSIS — I480 Paroxysmal atrial fibrillation: Secondary | ICD-10-CM

## 2015-09-12 MED ORDER — METOPROLOL TARTRATE 25 MG PO TABS: 12.5000 mg | ORAL_TABLET | Freq: Two times a day (BID) | ORAL | Status: DC

## 2015-09-12 NOTE — Telephone Encounter (Signed)
Talked with patient - no afib today but has had several days since Thursday where she is in it about 12 hours.  She has tried the PRN metoprolol but it does not seem to affect her rate much like it did prior to procedure.  Scared to go back to work because doesn't want to work with afib. Educated patient that afib could occur the entire 3 month healing process after the ablation but Butch Penny would address her concerns at her appointment today. Appt made for 11:30am today.
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
## 2015-09-12 NOTE — Patient Instructions (Signed)
Your physician has recommended you make the following change in your medication:  1)Metoprolol 12.5mg  twice daily (1/2 tablet twice daily)

## 2015-10-05 ENCOUNTER — Ambulatory Visit (HOSPITAL_COMMUNITY)
Admission: RE | Admit: 2015-10-05 | Discharge: 2015-10-05 | Disposition: A | Discharge status: Home / Self Care | Payer: Commercial Managed Care - HMO | Source: Ambulatory Visit | Attending: Nurse Practitioner | Admitting: Nurse Practitioner

## 2015-10-05 VITALS — BP 112/82 | HR 54 | Ht 71.0 in | Wt 206.8 lb

## 2015-10-05 DIAGNOSIS — I480 Paroxysmal atrial fibrillation: Secondary | ICD-10-CM | POA: Diagnosis not present
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
## 2015-11-22 ENCOUNTER — Encounter: Payer: Self-pay | Admitting: Internal Medicine

## 2015-11-22 ENCOUNTER — Ambulatory Visit (INDEPENDENT_AMBULATORY_CARE_PROVIDER_SITE_OTHER): Payer: Commercial Managed Care - HMO | Admitting: Internal Medicine

## 2015-11-22 VITALS — BP 122/84 | HR 67 | Ht 71.0 in | Wt 207.8 lb

## 2015-11-22 DIAGNOSIS — I480 Paroxysmal atrial fibrillation: Secondary | ICD-10-CM

## 2015-11-22 MED ORDER — METOPROLOL TARTRATE 25 MG PO TABS: 12.5000 mg | ORAL_TABLET | Freq: Two times a day (BID) | ORAL | Status: DC
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
## 2015-11-22 NOTE — Patient Instructions (Signed)
Medication Instructions:  Your physician has recommended you make the following change in your medication:  1) Stop Xarelto after your current prescription is out   Labwork: None ordered   Testing/Procedures: None ordered   Follow-Up: Your physician recommends that you schedule a follow-up appointment in: 3 months with Dr Rayann Heman   Any Other Special Instructions Will Be Listed Below (If Applicable).     If you need a refill on your cardiac medications before your next appointment, please call your pharmacy.

## 2016-02-14 ENCOUNTER — Encounter: Payer: Self-pay | Admitting: Internal Medicine

## 2016-02-14 ENCOUNTER — Ambulatory Visit (INDEPENDENT_AMBULATORY_CARE_PROVIDER_SITE_OTHER): Payer: Commercial Managed Care - HMO | Admitting: Internal Medicine

## 2016-02-14 VITALS — BP 126/94 | HR 74 | Ht 71.0 in | Wt 209.8 lb

## 2016-02-14 DIAGNOSIS — I480 Paroxysmal atrial fibrillation: Secondary | ICD-10-CM | POA: Diagnosis not present

## 2016-02-14 NOTE — Patient Instructions (Signed)
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
## 2016-07-02 ENCOUNTER — Other Ambulatory Visit: Payer: Self-pay | Admitting: *Deleted

## 2016-07-02 MED ORDER — METOPROLOL TARTRATE 25 MG PO TABS: 12.5000 mg | ORAL_TABLET | Freq: Two times a day (BID) | ORAL | 2 refills | Status: DC

## 2016-11-30 ENCOUNTER — Other Ambulatory Visit: Payer: Self-pay | Admitting: Internal Medicine

## 2016-12-02 ENCOUNTER — Other Ambulatory Visit: Payer: Self-pay | Admitting: Obstetrics and Gynecology

## 2016-12-02 DIAGNOSIS — Z1231 Encounter for screening mammogram for malignant neoplasm of breast: Secondary | ICD-10-CM

## 2016-12-13 ENCOUNTER — Ambulatory Visit
Admission: RE | Admit: 2016-12-13 | Discharge: 2016-12-13 | Disposition: A | Discharge status: Home / Self Care | Payer: Commercial Managed Care - HMO | Source: Ambulatory Visit | Attending: Obstetrics and Gynecology | Admitting: Obstetrics and Gynecology

## 2016-12-13 DIAGNOSIS — Z1231 Encounter for screening mammogram for malignant neoplasm of breast: Secondary | ICD-10-CM

## 2016-12-17 DIAGNOSIS — Z01419 Encounter for gynecological examination (general) (routine) without abnormal findings: Secondary | ICD-10-CM | POA: Diagnosis not present

## 2017-02-05 DIAGNOSIS — M5136 Other intervertebral disc degeneration, lumbar region: Secondary | ICD-10-CM | POA: Diagnosis not present

## 2017-02-05 DIAGNOSIS — M5126 Other intervertebral disc displacement, lumbar region: Secondary | ICD-10-CM | POA: Diagnosis not present

## 2017-02-05 DIAGNOSIS — M545 Low back pain: Secondary | ICD-10-CM | POA: Diagnosis not present

## 2017-02-11 DIAGNOSIS — M48061 Spinal stenosis, lumbar region without neurogenic claudication: Secondary | ICD-10-CM | POA: Diagnosis not present

## 2017-02-24 ENCOUNTER — Other Ambulatory Visit: Payer: Self-pay | Admitting: Internal Medicine

## 2017-03-30 ENCOUNTER — Other Ambulatory Visit: Payer: Self-pay | Admitting: Cardiology

## 2017-04-13 ENCOUNTER — Other Ambulatory Visit: Payer: Self-pay | Admitting: Cardiology

## 2017-05-08 ENCOUNTER — Encounter (INDEPENDENT_AMBULATORY_CARE_PROVIDER_SITE_OTHER): Payer: Self-pay | Admitting: Family Medicine

## 2017-05-27 ENCOUNTER — Other Ambulatory Visit: Payer: Self-pay | Admitting: Internal Medicine

## 2017-05-29 ENCOUNTER — Encounter (INDEPENDENT_AMBULATORY_CARE_PROVIDER_SITE_OTHER): Payer: Self-pay | Admitting: Family Medicine

## 2017-05-29 ENCOUNTER — Ambulatory Visit (INDEPENDENT_AMBULATORY_CARE_PROVIDER_SITE_OTHER): Payer: 59 | Admitting: Family Medicine

## 2017-05-29 VITALS — BP 113/79 | HR 75 | Temp 98.0°F | Ht 70.0 in | Wt 212.0 lb

## 2017-05-29 DIAGNOSIS — E669 Obesity, unspecified: Secondary | ICD-10-CM

## 2017-05-29 DIAGNOSIS — R0602 Shortness of breath: Secondary | ICD-10-CM | POA: Diagnosis not present

## 2017-05-29 DIAGNOSIS — E66811 Obesity, class 1: Secondary | ICD-10-CM

## 2017-05-29 DIAGNOSIS — Z6830 Body mass index (BMI) 30.0-30.9, adult: Secondary | ICD-10-CM

## 2017-05-29 DIAGNOSIS — R5383 Other fatigue: Secondary | ICD-10-CM | POA: Diagnosis not present

## 2017-05-29 DIAGNOSIS — Z1389 Encounter for screening for other disorder: Secondary | ICD-10-CM

## 2017-05-29 DIAGNOSIS — Z1331 Encounter for screening for depression: Secondary | ICD-10-CM

## 2017-05-29 DIAGNOSIS — E559 Vitamin D deficiency, unspecified: Secondary | ICD-10-CM
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
## 2017-05-30 LAB — COMPREHENSIVE METABOLIC PANEL
A/G RATIO: 1.7 (ref 1.2–2.2)
ALBUMIN: 4.3 g/dL (ref 3.5–5.5)
ALK PHOS: 84 IU/L (ref 39–117)
ALT: 16 IU/L (ref 0–32)
AST: 19 IU/L (ref 0–40)
BILIRUBIN TOTAL: 0.3 mg/dL (ref 0.0–1.2)
BUN / CREAT RATIO: 13 (ref 9–23)
BUN: 12 mg/dL (ref 6–24)
CHLORIDE: 108 mmol/L — AB (ref 96–106)
CO2: 22 mmol/L (ref 20–29)
Calcium: 9.2 mg/dL (ref 8.7–10.2)
Creatinine, Ser: 0.9 mg/dL (ref 0.57–1.00)
GFR calc non Af Amer: 72 mL/min/{1.73_m2} (ref >59)
GFR, EST AFRICAN AMERICAN: 83 mL/min/{1.73_m2} (ref >59)
GLUCOSE: 83 mg/dL (ref 65–99)
Globulin, Total: 2.6 g/dL (ref 1.5–4.5)
POTASSIUM: 4.7 mmol/L (ref 3.5–5.2)
Sodium: 144 mmol/L (ref 134–144)
TOTAL PROTEIN: 6.9 g/dL (ref 6.0–8.5)

## 2017-05-30 LAB — CBC WITH DIFFERENTIAL
BASOS ABS: 0 10*3/uL (ref 0.0–0.2)
BASOS: 0 %
EOS (ABSOLUTE): 0.2 10*3/uL (ref 0.0–0.4)
Eos: 4 %
Hematocrit: 40.6 % (ref 34.0–46.6)
Hemoglobin: 13.3 g/dL (ref 11.1–15.9)
IMMATURE GRANS (ABS): 0 10*3/uL (ref 0.0–0.1)
Immature Granulocytes: 0 %
LYMPHS ABS: 1.7 10*3/uL (ref 0.7–3.1)
LYMPHS: 31 %
MCH: 29.6 pg (ref 26.6–33.0)
MCHC: 32.8 g/dL (ref 31.5–35.7)
MCV: 90 fL (ref 79–97)
Monocytes Absolute: 0.5 10*3/uL (ref 0.1–0.9)
Monocytes: 8 %
NEUTROS ABS: 3.2 10*3/uL (ref 1.4–7.0)
NEUTROS PCT: 57 %
RBC: 4.5 x10E6/uL (ref 3.77–5.28)
RDW: 13.8 % (ref 12.3–15.4)
WBC: 5.6 10*3/uL (ref 3.4–10.8)

## 2017-05-30 LAB — INSULIN, RANDOM: INSULIN: 8.3 u[IU]/mL (ref 2.6–24.9)

## 2017-05-30 LAB — VITAMIN B12: Vitamin B-12: 525 pg/mL (ref 232–1245)

## 2017-05-30 LAB — LIPID PANEL WITH LDL/HDL RATIO
Cholesterol, Total: 161 mg/dL (ref 100–199)
HDL: 61 mg/dL (ref >39)
LDL CALC: 85 mg/dL (ref 0–99)
LDL/HDL RATIO: 1.4 ratio (ref 0.0–3.2)
Triglycerides: 75 mg/dL (ref 0–149)
VLDL CHOLESTEROL CAL: 15 mg/dL (ref 5–40)

## 2017-05-30 LAB — T3: T3 TOTAL: 113 ng/dL (ref 71–180)

## 2017-05-30 LAB — VITAMIN D 25 HYDROXY (VIT D DEFICIENCY, FRACTURES): Vit D, 25-Hydroxy: 26.1 ng/mL — ABNORMAL LOW (ref 30.0–100.0)

## 2017-05-30 LAB — TSH: TSH: 1.53 u[IU]/mL (ref 0.450–4.500)

## 2017-05-30 LAB — FOLATE: FOLATE: 4.7 ng/mL (ref >3.0)

## 2017-05-30 LAB — T4, FREE: FREE T4: 1.17 ng/dL (ref 0.82–1.77)

## 2017-05-30 LAB — HEMOGLOBIN A1C
Est. average glucose Bld gHb Est-mCnc: 103 mg/dL
Hgb A1c MFr Bld: 5.2 % (ref 4.8–5.6)

## 2017-06-12 ENCOUNTER — Ambulatory Visit (INDEPENDENT_AMBULATORY_CARE_PROVIDER_SITE_OTHER): Payer: 59 | Admitting: Family Medicine

## 2017-06-12 VITALS — BP 120/79 | HR 72 | Temp 97.8°F | Ht 70.0 in | Wt 207.0 lb

## 2017-06-12 DIAGNOSIS — E559 Vitamin D deficiency, unspecified: Secondary | ICD-10-CM | POA: Diagnosis not present

## 2017-06-12 DIAGNOSIS — Z6830 Body mass index (BMI) 30.0-30.9, adult: Secondary | ICD-10-CM | POA: Diagnosis not present

## 2017-06-12 DIAGNOSIS — E8881 Metabolic syndrome: Secondary | ICD-10-CM

## 2017-06-12 DIAGNOSIS — Z9189 Other specified personal risk factors, not elsewhere classified: Secondary | ICD-10-CM | POA: Diagnosis not present

## 2017-06-12 DIAGNOSIS — E669 Obesity, unspecified: Secondary | ICD-10-CM

## 2017-06-12 MED ORDER — VITAMIN D (ERGOCALCIFEROL) 1.25 MG (50000 UNIT) PO CAPS: 50000.0000 [IU] | ORAL_CAPSULE | ORAL | 0 refills | Status: DC
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
## 2017-06-25 ENCOUNTER — Ambulatory Visit (INDEPENDENT_AMBULATORY_CARE_PROVIDER_SITE_OTHER): Payer: 59 | Admitting: Physician Assistant

## 2017-06-25 VITALS — BP 111/74 | HR 66 | Temp 97.8°F | Ht 70.0 in | Wt 203.0 lb

## 2017-06-25 DIAGNOSIS — E669 Obesity, unspecified: Secondary | ICD-10-CM | POA: Diagnosis not present

## 2017-06-25 DIAGNOSIS — E559 Vitamin D deficiency, unspecified: Secondary | ICD-10-CM | POA: Diagnosis not present

## 2017-06-25 DIAGNOSIS — Z6830 Body mass index (BMI) 30.0-30.9, adult: Secondary | ICD-10-CM | POA: Diagnosis not present

## 2017-06-25 MED ORDER — VITAMIN D (ERGOCALCIFEROL) 1.25 MG (50000 UNIT) PO CAPS: 50000.0000 [IU] | ORAL_CAPSULE | ORAL | 0 refills | Status: DC
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
## 2017-07-08 ENCOUNTER — Ambulatory Visit (INDEPENDENT_AMBULATORY_CARE_PROVIDER_SITE_OTHER): Payer: 59 | Admitting: Physician Assistant

## 2017-07-08 VITALS — BP 121/77 | HR 57 | Temp 98.0°F | Ht 70.0 in | Wt 202.0 lb

## 2017-07-08 DIAGNOSIS — E66811 Obesity, class 1: Secondary | ICD-10-CM

## 2017-07-08 DIAGNOSIS — Z6830 Body mass index (BMI) 30.0-30.9, adult: Secondary | ICD-10-CM | POA: Diagnosis not present

## 2017-07-08 DIAGNOSIS — E559 Vitamin D deficiency, unspecified: Secondary | ICD-10-CM

## 2017-07-08 DIAGNOSIS — E669 Obesity, unspecified: Secondary | ICD-10-CM | POA: Insufficient documentation

## 2017-07-08 HISTORY — DX: Vitamin D deficiency, unspecified: E55.9

## 2017-07-08 HISTORY — DX: Obesity, class 1: E66.811

## 2017-07-08 HISTORY — DX: Obesity, unspecified: E66.9
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
## 2017-07-23 ENCOUNTER — Ambulatory Visit (INDEPENDENT_AMBULATORY_CARE_PROVIDER_SITE_OTHER): Payer: 59 | Admitting: Physician Assistant

## 2017-07-23 VITALS — BP 113/79 | HR 68 | Temp 97.7°F | Ht 70.0 in | Wt 200.0 lb

## 2017-07-23 DIAGNOSIS — E669 Obesity, unspecified: Secondary | ICD-10-CM | POA: Diagnosis not present

## 2017-07-23 DIAGNOSIS — E559 Vitamin D deficiency, unspecified: Secondary | ICD-10-CM

## 2017-07-23 DIAGNOSIS — Z9189 Other specified personal risk factors, not elsewhere classified: Secondary | ICD-10-CM

## 2017-07-23 DIAGNOSIS — Z6830 Body mass index (BMI) 30.0-30.9, adult: Secondary | ICD-10-CM

## 2017-07-23 DIAGNOSIS — E8881 Metabolic syndrome: Secondary | ICD-10-CM
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
## 2017-07-24 MED ORDER — VITAMIN D (ERGOCALCIFEROL) 1.25 MG (50000 UNIT) PO CAPS: 50000.0000 [IU] | ORAL_CAPSULE | ORAL | 0 refills | Status: DC

## 2017-08-04 ENCOUNTER — Ambulatory Visit (INDEPENDENT_AMBULATORY_CARE_PROVIDER_SITE_OTHER): Payer: 59 | Admitting: Physician Assistant

## 2017-08-04 VITALS — BP 107/75 | HR 67 | Temp 97.8°F | Ht 70.0 in | Wt 197.0 lb

## 2017-08-04 DIAGNOSIS — E669 Obesity, unspecified: Secondary | ICD-10-CM

## 2017-08-04 DIAGNOSIS — E559 Vitamin D deficiency, unspecified: Secondary | ICD-10-CM | POA: Diagnosis not present

## 2017-08-04 DIAGNOSIS — Z6830 Body mass index (BMI) 30.0-30.9, adult: Secondary | ICD-10-CM | POA: Diagnosis not present
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
## 2017-08-19 ENCOUNTER — Ambulatory Visit (INDEPENDENT_AMBULATORY_CARE_PROVIDER_SITE_OTHER): Payer: 59 | Admitting: Physician Assistant

## 2017-08-19 VITALS — BP 109/69 | HR 65 | Temp 97.9°F | Ht 70.0 in | Wt 196.0 lb

## 2017-08-19 DIAGNOSIS — E559 Vitamin D deficiency, unspecified: Secondary | ICD-10-CM | POA: Diagnosis not present

## 2017-08-19 DIAGNOSIS — Z9189 Other specified personal risk factors, not elsewhere classified: Secondary | ICD-10-CM | POA: Diagnosis not present

## 2017-08-19 DIAGNOSIS — E669 Obesity, unspecified: Secondary | ICD-10-CM | POA: Diagnosis not present

## 2017-08-19 DIAGNOSIS — Z6830 Body mass index (BMI) 30.0-30.9, adult: Secondary | ICD-10-CM | POA: Diagnosis not present

## 2017-08-19 DIAGNOSIS — E8881 Metabolic syndrome: Secondary | ICD-10-CM

## 2017-08-19 MED ORDER — VITAMIN D (ERGOCALCIFEROL) 1.25 MG (50000 UNIT) PO CAPS: 50000.0000 [IU] | ORAL_CAPSULE | ORAL | 0 refills | Status: DC
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
## 2017-09-08 DIAGNOSIS — Z23 Encounter for immunization: Secondary | ICD-10-CM | POA: Diagnosis not present

## 2017-09-09 ENCOUNTER — Ambulatory Visit (INDEPENDENT_AMBULATORY_CARE_PROVIDER_SITE_OTHER): Payer: 59 | Admitting: Physician Assistant

## 2017-09-09 VITALS — BP 102/70 | HR 72 | Temp 97.9°F | Ht 70.0 in | Wt 190.0 lb

## 2017-09-09 DIAGNOSIS — E88819 Insulin resistance, unspecified: Secondary | ICD-10-CM

## 2017-09-09 DIAGNOSIS — Z9189 Other specified personal risk factors, not elsewhere classified: Secondary | ICD-10-CM

## 2017-09-09 DIAGNOSIS — Z6830 Body mass index (BMI) 30.0-30.9, adult: Secondary | ICD-10-CM

## 2017-09-09 DIAGNOSIS — E559 Vitamin D deficiency, unspecified: Secondary | ICD-10-CM | POA: Diagnosis not present

## 2017-09-09 DIAGNOSIS — E669 Obesity, unspecified: Secondary | ICD-10-CM | POA: Diagnosis not present

## 2017-09-09 DIAGNOSIS — E8881 Metabolic syndrome: Secondary | ICD-10-CM | POA: Diagnosis not present

## 2017-09-09 HISTORY — DX: Metabolic syndrome: E88.81

## 2017-09-09 HISTORY — DX: Insulin resistance, unspecified: E88.819

## 2017-09-09 MED ORDER — VITAMIN D (ERGOCALCIFEROL) 1.25 MG (50000 UNIT) PO CAPS: 50000.0000 [IU] | ORAL_CAPSULE | ORAL | 0 refills | Status: DC
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
## 2017-09-10 LAB — VITAMIN D 25 HYDROXY (VIT D DEFICIENCY, FRACTURES): Vit D, 25-Hydroxy: 39.8 ng/mL (ref 30.0–100.0)

## 2017-09-10 LAB — HEMOGLOBIN A1C
ESTIMATED AVERAGE GLUCOSE: 97 mg/dL
Hgb A1c MFr Bld: 5 % (ref 4.8–5.6)

## 2017-09-10 LAB — INSULIN, RANDOM: INSULIN: 6.7 u[IU]/mL (ref 2.6–24.9)
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
## 2017-09-12 ENCOUNTER — Ambulatory Visit (INDEPENDENT_AMBULATORY_CARE_PROVIDER_SITE_OTHER): Payer: 59 | Admitting: Nurse Practitioner

## 2017-09-12 VITALS — BP 124/82 | HR 68 | Ht 70.0 in | Wt 193.0 lb

## 2017-09-12 DIAGNOSIS — I480 Paroxysmal atrial fibrillation: Secondary | ICD-10-CM | POA: Diagnosis not present

## 2017-09-12 MED ORDER — METOPROLOL TARTRATE 25 MG PO TABS: 25.0000 mg | ORAL_TABLET | Freq: Two times a day (BID) | ORAL | 3 refills | Status: DC | PRN

## 2017-09-12 NOTE — Patient Instructions (Signed)
Medication Instructions:   Your physician recommends that you continue on your current medications as directed. Please refer to the Current Medication list given to you today.   If you need a refill on your cardiac medications before your next appointment, please call your pharmacy.  Labwork: NONE ORDERED  TODAY    Testing/Procedures: NONE ORDERED  TODAY    Follow-Up:  Your physician wants you to follow-up in: ONE YEAR WITH [facility] You will receive a reminder letter in the mail two months in advance. If you don't receive a letter, please call our office to schedule the follow-up appointment.      Any Other Special Instructions Will Be Listed Below (If Applicable).                                                                                                                                                   

## 2017-09-30 ENCOUNTER — Ambulatory Visit (INDEPENDENT_AMBULATORY_CARE_PROVIDER_SITE_OTHER): Payer: 59 | Admitting: Physician Assistant

## 2017-09-30 VITALS — BP 118/78 | HR 74 | Temp 97.8°F | Ht 70.0 in | Wt 189.0 lb

## 2017-09-30 DIAGNOSIS — E669 Obesity, unspecified: Secondary | ICD-10-CM | POA: Diagnosis not present

## 2017-09-30 DIAGNOSIS — E559 Vitamin D deficiency, unspecified: Secondary | ICD-10-CM

## 2017-09-30 DIAGNOSIS — Z9189 Other specified personal risk factors, not elsewhere classified: Secondary | ICD-10-CM | POA: Diagnosis not present

## 2017-09-30 DIAGNOSIS — I480 Paroxysmal atrial fibrillation: Secondary | ICD-10-CM | POA: Diagnosis not present

## 2017-09-30 DIAGNOSIS — Z6830 Body mass index (BMI) 30.0-30.9, adult: Secondary | ICD-10-CM

## 2017-09-30 MED ORDER — VITAMIN D (ERGOCALCIFEROL) 1.25 MG (50000 UNIT) PO CAPS: 50000.0000 [IU] | ORAL_CAPSULE | ORAL | 0 refills | Status: DC
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
## 2017-10-23 ENCOUNTER — Ambulatory Visit (INDEPENDENT_AMBULATORY_CARE_PROVIDER_SITE_OTHER): Payer: 59 | Admitting: Physician Assistant

## 2017-10-23 VITALS — BP 95/68 | HR 70 | Temp 97.7°F | Ht 70.0 in | Wt 186.0 lb

## 2017-10-23 DIAGNOSIS — Z6830 Body mass index (BMI) 30.0-30.9, adult: Secondary | ICD-10-CM | POA: Diagnosis not present

## 2017-10-23 DIAGNOSIS — E559 Vitamin D deficiency, unspecified: Secondary | ICD-10-CM

## 2017-10-23 DIAGNOSIS — E669 Obesity, unspecified: Secondary | ICD-10-CM
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
## 2017-11-12 ENCOUNTER — Ambulatory Visit (INDEPENDENT_AMBULATORY_CARE_PROVIDER_SITE_OTHER): Payer: 59 | Admitting: Physician Assistant

## 2017-11-12 ENCOUNTER — Encounter (INDEPENDENT_AMBULATORY_CARE_PROVIDER_SITE_OTHER): Payer: Self-pay

## 2017-11-27 ENCOUNTER — Ambulatory Visit (INDEPENDENT_AMBULATORY_CARE_PROVIDER_SITE_OTHER): Payer: 59 | Admitting: Family Medicine

## 2017-12-03 ENCOUNTER — Ambulatory Visit (INDEPENDENT_AMBULATORY_CARE_PROVIDER_SITE_OTHER): Payer: 59 | Admitting: Family Medicine

## 2017-12-03 ENCOUNTER — Encounter (INDEPENDENT_AMBULATORY_CARE_PROVIDER_SITE_OTHER): Payer: Self-pay

## 2017-12-07 ENCOUNTER — Encounter (INDEPENDENT_AMBULATORY_CARE_PROVIDER_SITE_OTHER): Payer: Self-pay | Admitting: Family Medicine

## 2018-01-29 ENCOUNTER — Other Ambulatory Visit: Payer: Self-pay | Admitting: *Deleted

## 2018-01-29 MED ORDER — METOPROLOL TARTRATE 25 MG PO TABS: 25.0000 mg | ORAL_TABLET | Freq: Two times a day (BID) | ORAL | 1 refills | Status: DC | PRN

## 2018-02-03 ENCOUNTER — Other Ambulatory Visit: Payer: Self-pay | Admitting: Obstetrics and Gynecology

## 2018-02-03 DIAGNOSIS — Z1231 Encounter for screening mammogram for malignant neoplasm of breast: Secondary | ICD-10-CM

## 2018-02-05 DIAGNOSIS — J0100 Acute maxillary sinusitis, unspecified: Secondary | ICD-10-CM | POA: Diagnosis not present

## 2018-02-05 DIAGNOSIS — J039 Acute tonsillitis, unspecified: Secondary | ICD-10-CM | POA: Diagnosis not present

## 2018-02-20 ENCOUNTER — Ambulatory Visit: Payer: 59

## 2018-03-19 ENCOUNTER — Ambulatory Visit
Admission: RE | Admit: 2018-03-19 | Discharge: 2018-03-19 | Disposition: A | Discharge status: Home / Self Care | Payer: 59 | Source: Ambulatory Visit | Attending: Obstetrics and Gynecology | Admitting: Obstetrics and Gynecology

## 2018-03-19 DIAGNOSIS — Z1231 Encounter for screening mammogram for malignant neoplasm of breast: Secondary | ICD-10-CM | POA: Diagnosis not present

## 2018-07-07 DIAGNOSIS — Z0100 Encounter for examination of eyes and vision without abnormal findings: Secondary | ICD-10-CM | POA: Diagnosis not present

## 2018-09-04 DIAGNOSIS — Z23 Encounter for immunization: Secondary | ICD-10-CM | POA: Diagnosis not present

## 2018-09-04 DIAGNOSIS — J0120 Acute ethmoidal sinusitis, unspecified: Secondary | ICD-10-CM | POA: Diagnosis not present

## 2018-09-27 DIAGNOSIS — N3090 Cystitis, unspecified without hematuria: Secondary | ICD-10-CM | POA: Diagnosis not present

## 2018-09-27 DIAGNOSIS — N3001 Acute cystitis with hematuria: Secondary | ICD-10-CM | POA: Diagnosis not present

## 2018-11-30 ENCOUNTER — Other Ambulatory Visit: Payer: Self-pay | Admitting: Internal Medicine

## 2018-11-30 MED ORDER — METOPROLOL TARTRATE 25 MG PO TABS: 25.0000 mg | ORAL_TABLET | Freq: Two times a day (BID) | ORAL | 0 refills | Status: DC | PRN

## 2018-11-30 NOTE — Telephone Encounter (Signed)
Pt's medication was sent to pt's pharmacy as requested. Confirmation received.  °

## 2018-11-30 NOTE — Telephone Encounter (Signed)
°*  STAT* If patient is at the pharmacy, call can be transferred to refill team.   1. Which medications need to be refilled? (please list name of each medication and dose if known) Metoprolol 25mg   2. Which pharmacy/location (including street and city if local pharmacy) is medication to be sent to?[facility]  3. Do they need a 30 day or 90 day supply? Past due appt with [facility] 12-30-18

## 2018-12-10 ENCOUNTER — Encounter: Payer: Self-pay | Admitting: Internal Medicine

## 2018-12-30 ENCOUNTER — Ambulatory Visit: Payer: 59 | Admitting: Internal Medicine

## 2018-12-30 ENCOUNTER — Encounter: Payer: Self-pay | Admitting: Internal Medicine

## 2018-12-30 VITALS — BP 112/74 | HR 63 | Ht 71.0 in | Wt 204.8 lb

## 2018-12-30 DIAGNOSIS — I480 Paroxysmal atrial fibrillation: Secondary | ICD-10-CM

## 2018-12-30 MED ORDER — METOPROLOL TARTRATE 25 MG PO TABS: 25.0000 mg | ORAL_TABLET | Freq: Two times a day (BID) | ORAL | 11 refills | Status: DC | PRN
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
## 2018-12-30 NOTE — Patient Instructions (Addendum)
Medication Instructions:  Your physician recommends that you continue on your current medications as directed. Please refer to the Current Medication list given to you today.  Labwork: None ordered.  Testing/Procedures: None ordered.  Follow-Up: Your physician wants you to follow-up in: one year with Ricky at the AFIB clinic.  You will receive a reminder letter in the mail two months in advance. If you don't receive a letter, please call our office to schedule the follow-up appointment.   Any Other Special Instructions Will Be Listed Below (If Applicable).  If you need a refill on your cardiac medications before your next appointment, please call your pharmacy.

## 2019-06-02 ENCOUNTER — Ambulatory Visit: Payer: 59 | Admitting: Podiatry

## 2019-06-02 ENCOUNTER — Ambulatory Visit (INDEPENDENT_AMBULATORY_CARE_PROVIDER_SITE_OTHER): Payer: 59

## 2019-06-02 ENCOUNTER — Encounter: Payer: Self-pay | Admitting: Podiatry

## 2019-06-02 ENCOUNTER — Other Ambulatory Visit: Payer: Self-pay

## 2019-06-02 ENCOUNTER — Other Ambulatory Visit: Payer: Self-pay | Admitting: Podiatry

## 2019-06-02 VITALS — Temp 98.3°F

## 2019-06-02 DIAGNOSIS — M79671 Pain in right foot: Secondary | ICD-10-CM | POA: Diagnosis not present

## 2019-06-02 DIAGNOSIS — S92314A Nondisplaced fracture of first metatarsal bone, right foot, initial encounter for closed fracture: Secondary | ICD-10-CM

## 2019-06-03 ENCOUNTER — Other Ambulatory Visit: Payer: Self-pay | Admitting: Obstetrics and Gynecology

## 2019-06-03 ENCOUNTER — Other Ambulatory Visit: Payer: Self-pay | Admitting: Radiology

## 2019-06-03 DIAGNOSIS — Z1231 Encounter for screening mammogram for malignant neoplasm of breast: Secondary | ICD-10-CM

## 2019-06-03 NOTE — Progress Notes (Signed)
Subjective:   Patient ID: Lindsey Bishop, female   DOB: 58 y.o.   MRN: [id number]   HPI Patient states that she dropped a large board on her right foot 3 weeks ago and it is been very tender since and swollen and states that she is having trouble being able to bear weight.  States she has swelling into her midfoot that has been present.  Patient does not smoke likes to be active   Review of Systems  All other systems reviewed and are negative.       Objective:  Physical Exam Vitals signs and nursing note reviewed.  Constitutional:      Appearance: She is well-developed.  Pulmonary:     Effort: Pulmonary effort is normal.  Musculoskeletal: Normal range of motion.  Skin:    General: Skin is warm.  Neurological:     Mental Status: She is alert.     Neurovascular status found to be intact muscle strength found to be adequate range of motion within normal limits with negative Homans sign noted.  Patient is found to have acute inflammation of the right midfoot medial side around the first metatarsal base and shaft and has moderate swelling occurring to the entire forefoot midfoot and into the ankle right.  Patient did have good digital perfusion well oriented x3     Assessment:  Severe acute injury to the right foot with possibility for fracture     Plan:  H&P reviewed fracture of the right foot and at this time apply Unna boot Ace wrap and air fracture walker to completely immobilize the foot due to the fracture.  Patient will be seen back to recheck in 3 weeks or earlier if needed  X-ray indicates that there is a fracture of the base of the first metatarsal with no indications of distraction or joint involvement

## 2019-06-23 ENCOUNTER — Ambulatory Visit: Payer: 59 | Admitting: Podiatry

## 2019-06-25 ENCOUNTER — Ambulatory Visit: Payer: 59 | Admitting: Podiatry

## 2019-06-25 ENCOUNTER — Ambulatory Visit (INDEPENDENT_AMBULATORY_CARE_PROVIDER_SITE_OTHER): Payer: 59

## 2019-06-25 ENCOUNTER — Other Ambulatory Visit: Payer: Self-pay

## 2019-06-25 ENCOUNTER — Encounter: Payer: Self-pay | Admitting: Podiatry

## 2019-06-25 VITALS — Temp 97.2°F

## 2019-06-25 DIAGNOSIS — S92314D Nondisplaced fracture of first metatarsal bone, right foot, subsequent encounter for fracture with routine healing: Secondary | ICD-10-CM

## 2019-06-29 NOTE — Progress Notes (Signed)
Subjective:   Patient ID: Lindsey Bishop, female   DOB: 58 y.o.   MRN: [id number]   HPI Patient states my foot is feeling some better   ROS      Objective:  Physical Exam  Neurovascular status intact with patient's right medial side of the foot improved with quite a bit of discomfort at the base of the first metatarsal still noted upon excessive ambulation or pressure     Assessment:  Fracture of the base of the first metatarsal right     Plan:  H&P x-ray reviewed and advised on continued immobilization ice therapy with gradual increase in activity over the next few weeks.  Patient will be seen back in 4 weeks or earlier if needed  X-ray indicates fracture of the base of the first metatarsal right with gradual healing occurring and signs of callus formation

## 2019-07-22 ENCOUNTER — Other Ambulatory Visit: Payer: Self-pay | Admitting: Radiology

## 2019-07-22 ENCOUNTER — Ambulatory Visit
Admission: RE | Admit: 2019-07-22 | Discharge: 2019-07-22 | Disposition: A | Discharge status: Home / Self Care | Payer: 59 | Source: Ambulatory Visit | Attending: Radiology | Admitting: Radiology

## 2019-07-22 ENCOUNTER — Other Ambulatory Visit: Payer: Self-pay

## 2019-07-22 DIAGNOSIS — Z1231 Encounter for screening mammogram for malignant neoplasm of breast: Secondary | ICD-10-CM

## 2019-07-23 ENCOUNTER — Encounter: Payer: Self-pay | Admitting: Podiatry

## 2019-07-23 ENCOUNTER — Ambulatory Visit (INDEPENDENT_AMBULATORY_CARE_PROVIDER_SITE_OTHER): Payer: 59

## 2019-07-23 ENCOUNTER — Ambulatory Visit: Payer: 59 | Admitting: Podiatry

## 2019-07-23 DIAGNOSIS — S92314D Nondisplaced fracture of first metatarsal bone, right foot, subsequent encounter for fracture with routine healing: Secondary | ICD-10-CM

## 2019-07-23 DIAGNOSIS — M79671 Pain in right foot: Secondary | ICD-10-CM

## 2019-07-23 NOTE — Progress Notes (Signed)
Subjective:   Patient ID: Lindsey Bishop, female   DOB: 58 y.o.   MRN: [id number]   HPI Patient presents stating that she traumatized her foot around 4 days ago on the outside and is developed some significant swelling and also continues to have discomfort on the inside and does not feel like she is walking on the outside of her foot and admits that she is not been wearing her boot as we had indicated all the time   ROS      Objective:  Physical Exam  Neurovascular status was found to be intact negative Homans sign is noted with patient developing quite a bit of swelling in the outside of the right foot around the fifth metatarsal base cuboid bone with the first metatarsal still moderately swollen and discomforting but is improving as time goes on but she is not walking on that side of her foot     Assessment:  Patient is now experiencing 2 different pains with one being new trauma to the outside the right and continued healing of the inside of the right foot     Plan:  H&P reviewed condition and recommended aggressive ice therapy and continued boot usage and the consideration that there may be a cuboid dislocation and if it were to remain persistently tender will need to consider CT scan.  Continue immobilization and explained the healing of the inside of the foot with healing still occurring with secondary bone intent  X-ray indicates that there is some healing to go on the inside of the right foot with secondary bone exposure but I did encourage walking on the side of her foot as she is decalcifying.  I did not note any significant changes on the outside that compared to previous x-ray raise and I do not think that there is a dislocation but I will watch it at next visit

## 2019-08-20 ENCOUNTER — Other Ambulatory Visit: Payer: Self-pay

## 2019-08-20 ENCOUNTER — Ambulatory Visit (INDEPENDENT_AMBULATORY_CARE_PROVIDER_SITE_OTHER): Payer: 59

## 2019-08-20 ENCOUNTER — Ambulatory Visit: Payer: 59 | Admitting: Podiatry

## 2019-08-20 ENCOUNTER — Encounter: Payer: Self-pay | Admitting: Podiatry

## 2019-08-20 DIAGNOSIS — S92314D Nondisplaced fracture of first metatarsal bone, right foot, subsequent encounter for fracture with routine healing: Secondary | ICD-10-CM | POA: Diagnosis not present

## 2019-08-20 NOTE — Progress Notes (Signed)
Subjective:   Patient ID: Lindsey Bishop, female   DOB: 58 y.o.   MRN: [id number]   HPI Patient states my foot is improved but it still swells up periodically and I am wearing a tennis shoe and not wearing the boot anymore   ROS      Objective:  Physical Exam  Neurovascular status intact with no excessive sweating of the foot not no mottled skin appearance with discomfort of moderate nature noted around the first metatarsal base no range of motion loss or other pathology     Assessment:  Fracture of the first metatarsal base right which appears to be healing with no current clinical indications of pain syndrome or RSD S     Plan:  H&P reviewed x-ray and discussed with mild mottled appearance to the bone which I am assuming will re-calcify as she walks more on her foot.  I did instruct her on the importance of this and I gave her all clinical signs of chronic pain syndrome and what to watch for and if anything were to occur she is to let us know immediately  X-rays indicate there is mild mottled appearance to the bone structure right with no indications of other pathology with healing base of first metatarsal fracture right

## 2019-08-28 ENCOUNTER — Other Ambulatory Visit: Payer: Self-pay | Admitting: Internal Medicine

## 2019-09-24 ENCOUNTER — Telehealth: Payer: Self-pay | Admitting: Podiatry

## 2019-09-24 NOTE — Telephone Encounter (Signed)
Pt called requesting medical records release form be e-mailed to her at [email or web address]. Pt requested the disc of x-rays be mailed to Dr. Lucia Gaskins at [facility].

## 2019-09-24 NOTE — Telephone Encounter (Signed)
I wanted to know if you could send some x-rays over to [facility], attention Dr. Lucia Gaskins. Their fax number is [phone]. If you need to speak to me, please give me a call.

## 2019-09-24 NOTE — Telephone Encounter (Signed)
Left voicemail letting pt know she would need to fill out and sign a medical records release form. Also stated we would have to burn x-rays to a disc and she could either pick them up or we would have to mail them.

## 2019-09-28 ENCOUNTER — Encounter: Payer: Self-pay | Admitting: Podiatry

## 2019-09-28 NOTE — Progress Notes (Signed)
Disc of x-rays was placed up front to be mailed to [facility] Attn Dr. Lucia Gaskins per patients request.

## 2019-10-28 ENCOUNTER — Other Ambulatory Visit: Payer: Self-pay

## 2019-10-28 DIAGNOSIS — Z20822 Contact with and (suspected) exposure to covid-19: Secondary | ICD-10-CM

## 2019-10-30 LAB — NOVEL CORONAVIRUS, NAA: SARS-CoV-2, NAA: NOT DETECTED

## 2019-12-25 ENCOUNTER — Other Ambulatory Visit: Payer: Self-pay | Admitting: Internal Medicine

## 2020-01-24 ENCOUNTER — Other Ambulatory Visit: Payer: Self-pay | Admitting: Internal Medicine

## 2020-02-19 ENCOUNTER — Other Ambulatory Visit: Payer: Self-pay | Admitting: Internal Medicine

## 2020-03-04 ENCOUNTER — Other Ambulatory Visit: Payer: Self-pay | Admitting: Internal Medicine

## 2020-05-12 ENCOUNTER — Other Ambulatory Visit: Payer: Self-pay

## 2020-05-12 ENCOUNTER — Ambulatory Visit (HOSPITAL_COMMUNITY)
Admission: RE | Admit: 2020-05-12 | Discharge: 2020-05-12 | Disposition: A | Discharge status: Home / Self Care | Payer: 59 | Source: Ambulatory Visit | Attending: Physician Assistant | Admitting: Physician Assistant

## 2020-05-12 ENCOUNTER — Encounter (HOSPITAL_COMMUNITY): Payer: Self-pay | Admitting: Physician Assistant

## 2020-05-12 VITALS — BP 130/80 | HR 70 | Ht 71.0 in | Wt 215.0 lb

## 2020-05-12 DIAGNOSIS — Z79899 Other long term (current) drug therapy: Secondary | ICD-10-CM | POA: Insufficient documentation

## 2020-05-12 DIAGNOSIS — Z888 Allergy status to other drugs, medicaments and biological substances status: Secondary | ICD-10-CM | POA: Insufficient documentation

## 2020-05-12 DIAGNOSIS — I480 Paroxysmal atrial fibrillation: Secondary | ICD-10-CM | POA: Diagnosis not present

## 2020-05-12 DIAGNOSIS — Z8619 Personal history of other infectious and parasitic diseases: Secondary | ICD-10-CM | POA: Insufficient documentation

## 2020-05-12 MED ORDER — METOPROLOL TARTRATE 25 MG PO TABS: 25.0000 mg | ORAL_TABLET | Freq: Two times a day (BID) | ORAL | 11 refills | Status: DC
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
## 2020-07-11 ENCOUNTER — Other Ambulatory Visit: Payer: Self-pay | Admitting: Radiology

## 2020-07-11 DIAGNOSIS — Z1231 Encounter for screening mammogram for malignant neoplasm of breast: Secondary | ICD-10-CM

## 2020-07-26 ENCOUNTER — Other Ambulatory Visit: Payer: Self-pay

## 2020-07-26 ENCOUNTER — Ambulatory Visit
Admission: RE | Admit: 2020-07-26 | Discharge: 2020-07-26 | Disposition: A | Discharge status: Home / Self Care | Payer: 59 | Source: Ambulatory Visit | Attending: Radiology | Admitting: Radiology

## 2020-07-26 ENCOUNTER — Ambulatory Visit: Payer: 59

## 2020-07-26 DIAGNOSIS — Z1231 Encounter for screening mammogram for malignant neoplasm of breast: Secondary | ICD-10-CM

## 2021-06-01 ENCOUNTER — Other Ambulatory Visit (HOSPITAL_COMMUNITY): Payer: Self-pay | Admitting: Physician Assistant

## 2021-06-13 ENCOUNTER — Ambulatory Visit: Payer: 59 | Admitting: Physician Assistant

## 2021-07-05 ENCOUNTER — Other Ambulatory Visit (HOSPITAL_COMMUNITY): Payer: Self-pay | Admitting: Internal Medicine

## 2021-07-09 ENCOUNTER — Other Ambulatory Visit: Payer: Self-pay

## 2021-07-09 ENCOUNTER — Ambulatory Visit: Payer: 59 | Admitting: Internal Medicine

## 2021-07-09 VITALS — BP 134/82 | HR 67 | Ht 71.0 in | Wt 211.0 lb

## 2021-07-09 DIAGNOSIS — I4819 Other persistent atrial fibrillation: Secondary | ICD-10-CM
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
## 2021-07-09 NOTE — Patient Instructions (Addendum)
Medication Instructions:  Your physician recommends that you continue on your current medications as directed. Please refer to the Current Medication list given to you today.  Labwork: None ordered.  Testing/Procedures: None ordered.  Follow-Up: Your physician wants you to follow-up in: 12 months with  Thompson Grayer, MD or one of the following Advanced Practice Providers on your designated Care Team:      You will receive a reminder letter in the mail two months in advance. If you don't receive a letter, please call our office to schedule the follow-up appointment.   Any Other Special Instructions Will Be Listed Below (If Applicable).  If you need a refill on your cardiac medications before your next appointment, please call your pharmacy.

## 2021-08-07 ENCOUNTER — Other Ambulatory Visit (HOSPITAL_COMMUNITY): Payer: Self-pay | Admitting: Physician Assistant

## 2021-09-06 ENCOUNTER — Other Ambulatory Visit (HOSPITAL_COMMUNITY): Payer: Self-pay | Admitting: Internal Medicine

## 2021-09-18 ENCOUNTER — Other Ambulatory Visit: Payer: Self-pay | Admitting: Radiology

## 2021-09-18 DIAGNOSIS — Z1231 Encounter for screening mammogram for malignant neoplasm of breast: Secondary | ICD-10-CM

## 2021-10-03 LAB — HM PAP SMEAR

## 2021-10-24 ENCOUNTER — Ambulatory Visit
Admission: RE | Admit: 2021-10-24 | Discharge: 2021-10-24 | Disposition: A | Discharge status: Home / Self Care | Payer: 59 | Source: Ambulatory Visit | Attending: Radiology | Admitting: Radiology

## 2021-10-24 DIAGNOSIS — Z1231 Encounter for screening mammogram for malignant neoplasm of breast: Secondary | ICD-10-CM

## 2021-12-25 ENCOUNTER — Other Ambulatory Visit: Payer: Self-pay

## 2021-12-26 ENCOUNTER — Encounter: Payer: Self-pay | Admitting: Nurse Practitioner

## 2021-12-26 ENCOUNTER — Ambulatory Visit: Payer: 59 | Admitting: Nurse Practitioner

## 2021-12-26 VITALS — BP 102/80 | HR 76 | Temp 96.8°F | Ht 70.75 in | Wt 211.2 lb

## 2021-12-26 DIAGNOSIS — Z23 Encounter for immunization: Secondary | ICD-10-CM | POA: Diagnosis not present

## 2021-12-26 DIAGNOSIS — Z1211 Encounter for screening for malignant neoplasm of colon: Secondary | ICD-10-CM

## 2021-12-26 DIAGNOSIS — E559 Vitamin D deficiency, unspecified: Secondary | ICD-10-CM | POA: Diagnosis not present

## 2021-12-26 DIAGNOSIS — E663 Overweight: Secondary | ICD-10-CM | POA: Diagnosis not present

## 2021-12-26 MED ORDER — VITAMIN D (ERGOCALCIFEROL) 1.25 MG (50000 UNIT) PO CAPS: 50000.0000 [IU] | ORAL_CAPSULE | ORAL | 0 refills | Status: DC

## 2021-12-26 NOTE — Assessment & Plan Note (Signed)
Lowest weight 160lb at age 61 Previous weight loss attempts:  1. Use of phentermine in past: no weight loss 2. Participated in the weight management program within [facility] 2018-2019. 220lbs to 189lbs (maintain weight of 189 x 3-68months). Stopped program due to difficulty following recommended diet and need to take care of elderly mother. 3. Intermittent fasting (16hrs fatsing and 8hrs eating)x 53months, lost 7lbs.  Current diet: heart healthy choices, unknown daily calories. No exercise routine.  We discussed the importance of incorporating exercise routine in combination with heart healthy choices and portion control. We discussed use of medications to aid with weight loss (phentermine and saxenda injection). With hx of A-fib, I cautioned about her risk of palpitations, cardiovascular/cerebrovascular event, anxiety, and insomnia. Since BMI is <30 and no risk of DM, I did not recommend use of saxenda. She verbalized understanding and agreed to start exercise routine. Provided printed information on exercise.

## 2021-12-26 NOTE — Patient Instructions (Signed)
Thank you for choosing [facility]  Sign medical release form to get your records from [facility]  Start regular exercise: 120mins per week, combination of cardio and weight training.  Start vit. D supplement: 50000IU weekly x 8weeks, then switch to 5000IU daily from over the counter Also start calcium supplement 800mg  daily for bone health.  Exercising to Stay Healthy To become healthy and stay healthy, it is recommended that you do moderate-intensity and vigorous-intensity exercise. You can tell that you are exercising at a moderate intensity if your heart starts beating faster and you start breathing faster but can still hold a conversation. You can tell that you are exercising at a vigorous intensity if you are breathing much harder and faster and cannot hold a conversation while exercising. How can exercise benefit me? Exercising regularly is important. It has many health benefits, such as: Improving overall fitness, flexibility, and endurance. Increasing bone density. Helping with weight control. Decreasing body fat. Increasing muscle strength and endurance. Reducing stress and tension, anxiety, depression, or anger. Improving overall health. What guidelines should I follow while exercising? Before you start a new exercise program, talk with your health care provider. Do not exercise so much that you hurt yourself, feel dizzy, or get very short of breath. Wear comfortable clothes and wear shoes with good support. Drink plenty of water while you exercise to prevent dehydration or heat stroke. Work out until your breathing and your heartbeat get faster (moderate intensity). How often should I exercise? Choose an activity that you enjoy, and set realistic goals. Your health care provider can help you make an activity plan that is individually designed and works best for you. Exercise regularly as told by your health care provider. This may include: Doing strength  training two times a week, such as: Lifting weights. Using resistance bands. Push-ups. Sit-ups. Yoga. Doing a certain intensity of exercise for a given amount of time. Choose from these options: A total of 150 minutes of moderate-intensity exercise every week. A total of 75 minutes of vigorous-intensity exercise every week. A mix of moderate-intensity and vigorous-intensity exercise every week. Children, pregnant women, people who have not exercised regularly, people who are overweight, and older adults may need to talk with a health care provider about what activities are safe to perform. If you have a medical condition, be sure to talk with your health care provider before you start a new exercise program. What are some exercise ideas? Moderate-intensity exercise ideas include: Walking 1 mile (1.6 km) in about 15 minutes. Biking. Hiking. Golfing. Dancing. Water aerobics. Vigorous-intensity exercise ideas include: Walking 4.5 miles (7.2 km) or more in about 1 hour. Jogging or running 5 miles (8 km) in about 1 hour. Biking 10 miles (16.1 km) or more in about 1 hour. Lap swimming. Roller-skating or in-line skating. Cross-country skiing. Vigorous competitive sports, such as football, basketball, and soccer. Jumping rope. Aerobic dancing. What are some everyday activities that can help me get exercise? Yard work, such as: Psychologist, educational. Raking and bagging leaves. Washing your car. Pushing a stroller. Shoveling snow. Gardening. Washing windows or floors. How can I be more active in my day-to-day activities? Use stairs instead of an elevator. Take a walk during your lunch break. If you drive, park your car farther away from your work or school. If you take public transportation, get off one stop early and walk the rest of the way. Stand up or walk around during all of your indoor phone  calls. Get up, stretch, and walk around every 30 minutes throughout the day. Enjoy  exercise with a friend. Support to continue exercising will help you keep a regular routine of activity. Where to find more information You can find more information about exercising to stay healthy from: U.S. Department of Health and Human Services: [employer].at Centers for Disease Control and Prevention (CDC): http://www.wolf.info/ Summary Exercising regularly is important. It will improve your overall fitness, flexibility, and endurance. Regular exercise will also improve your overall health. It can help you control your weight, reduce stress, and improve your bone density. Do not exercise so much that you hurt yourself, feel dizzy, or get very short of breath. Before you start a new exercise program, talk with your health care provider. This information is not intended to replace advice given to you by your health care provider. Make sure you discuss any questions you have with your health care provider. Document Revised: 03/02/2021 Document Reviewed: 03/02/2021 Elsevier Patient Education  [place].

## 2021-12-26 NOTE — Assessment & Plan Note (Signed)
Vit.d level checked by GYN on 09/2021: 38 Resume 50000Iu weekly x 8weeks, then switch to 2000-5000IU daily and calcium 800mg  daily OTC.

## 2021-12-26 NOTE — Progress Notes (Signed)
Subjective:  Patient ID: Lindsey Bishop, female    DOB: 23-Sep-1961  Age: 61 y.o. MRN: [id number]  CC: Establish Care (New patient/Pt would like to discuss weight loss options. /Pt is fasting. /Tdap vaccine given today. )  HPI  Vitamin D deficiency Vit.d level checked by GYN on 09/2021: 38 Resume 50000Iu weekly x 8weeks, then switch to 2000-5000IU daily and calcium 800mg  daily OTC.  Overweight Lowest weight 160lb at age 86 Previous weight loss attempts:  1. Use of phentermine in past: no weight loss 2. Participated in the weight management program within [facility] 2018-2019. 220lbs to 189lbs (maintain weight of 189 x 3-36months). Stopped program due to difficulty following recommended diet and need to take care of elderly mother. 3. Intermittent fasting (16hrs fatsing and 8hrs eating)x 24months, lost 7lbs.  Current diet: heart healthy choices, unknown daily calories. No exercise routine.  We discussed the importance of incorporating exercise routine in combination with heart healthy choices and portion control. We discussed use of medications to aid with weight loss (phentermine and saxenda injection). With hx of A-fib, I cautioned about her risk of palpitations, cardiovascular/cerebrovascular event, anxiety, and insomnia. Since BMI is <30 and no risk of DM, I did not recommend use of saxenda. She verbalized understanding and agreed to start exercise routine. Provided printed information on exercise.  Reviewed lab results via Lindsey Bishop's portal on her phone. Labs done by GYN 10/2021: CBC, CMP, TSH, HgbA1c at 5.3%, lipid panel (all normal), and vit. D at 56.  Wt Readings from Last 3 Encounters:  12/26/21 211 lb 3.2 oz (95.8 kg)  07/09/21 211 lb (95.7 kg)  05/12/20 215 lb (97.5 kg)    Reviewed past Medical, Social and Family history today.  Family History  Problem Relation Age of Onset   Diabetes Mother    Hypertension Mother    Cancer Mother 85       Breast   Diabetes  Father    Diabetes Brother    Diabetes Maternal Grandmother    Heart attack Maternal Grandmother    Diabetes Other    Diabetes Other    Diabetes Other    Diabetes Other    Stroke Neg Hx     Outpatient Medications Prior to Visit  Medication Sig Dispense Refill   metoprolol tartrate (LOPRESSOR) 25 MG tablet TAKE 1 TABLET (25 MG TOTAL) BY MOUTH 2 (TWO) TIMES DAILY. NEED OV. 60 tablet 9   calcium carbonate (OSCAL) 1500 (600 Ca) MG TABS tablet Take by mouth 2 (two) times daily with a meal. (Patient not taking: Reported on 07/09/2021)     Cholecalciferol (VITAMIN D) 2000 units CAPS Take by mouth. (Patient not taking: Reported on 07/09/2021)     Cyanocobalamin (B-12) 2500 MCG TABS Take by mouth. (Patient not taking: Reported on 07/09/2021)     glucosamine-chondroitin 500-400 MG tablet Take 1 tablet by mouth 3 (three) times daily. (Patient not taking: Reported on 07/09/2021)     magnesium 30 MG tablet Take 30 mg by mouth 2 (two) times daily. (Patient not taking: Reported on 07/09/2021)     Turmeric Curcumin 500 MG CAPS Take by mouth. (Patient not taking: Reported on 07/09/2021)     TURMERIC CURCUMIN PO Take by mouth. (Patient not taking: Reported on 07/09/2021)     No facility-administered medications prior to visit.    ROS See HPI  Objective:  BP 102/80 (BP Location: Left Arm, Patient Position: Sitting, Cuff Size: Large)    Pulse 76  Temp (!) 96.8 F (36 C) (Temporal)    Ht 5' 10.75" (1.797 m)    Wt 211 lb 3.2 oz (95.8 kg)    SpO2 99%    BMI 29.66 kg/m   Physical Exam Constitutional:      General: She is not in acute distress. Cardiovascular:     Rate and Rhythm: Normal rate.  Pulmonary:     Effort: Pulmonary effort is normal.  Neurological:     Mental Status: She is alert and oriented to person, place, and time.  Psychiatric:        Mood and Affect: Mood normal.        Behavior: Behavior normal.        Thought Content: Thought content normal.   Assessment & Plan:  This visit  occurred during the SARS-CoV-2 public health emergency.  Safety protocols were in place, including screening questions prior to the visit, additional usage of staff PPE, and extensive cleaning of exam room while observing appropriate contact time as indicated for disinfecting solutions.   Ishi was seen today for establish care.  Diagnoses and all orders for this visit:  Overweight  Vitamin D deficiency -     Vitamin D, Ergocalciferol, (DRISDOL) 1.25 MG (50000 UNIT) CAPS capsule; Take 1 capsule (50,000 Units total) by mouth every 7 (seven) days.  Colon cancer screening -     Ambulatory referral to Gastroenterology  Need for diphtheria-tetanus-pertussis (Tdap) vaccine -     Tdap vaccine greater than or equal to 7yo IM  Sign medical release form to get your records from [facility] Start regular exercise: 18mins per week, combination of cardio and weight training.  Problem List Items Addressed This Visit       Other   Overweight - Primary    Lowest weight 160lb at age 42 Previous weight loss attempts:  1. Use of phentermine in past: no weight loss 2. Participated in the weight management program within [facility] 2018-2019. 220lbs to 189lbs (maintain weight of 189 x 3-40months). Stopped program due to difficulty following recommended diet and need to take care of elderly mother. 3. Intermittent fasting (16hrs fatsing and 8hrs eating)x 74months, lost 7lbs.  Current diet: heart healthy choices, unknown daily calories. No exercise routine.  We discussed the importance of incorporating exercise routine in combination with heart healthy choices and portion control. We discussed use of medications to aid with weight loss (phentermine and saxenda injection). With hx of A-fib, I cautioned about her risk of palpitations, cardiovascular/cerebrovascular event, anxiety, and insomnia. Since BMI is <30 and no risk of DM, I did not recommend use of saxenda. She verbalized understanding and  agreed to start exercise routine. Provided printed information on exercise.      Vitamin D deficiency    Vit.d level checked by GYN on 09/2021: 38 Resume 50000Iu weekly x 8weeks, then switch to 2000-5000IU daily and calcium 800mg  daily OTC.      Relevant Medications   Vitamin D, Ergocalciferol, (DRISDOL) 1.25 MG (50000 UNIT) CAPS capsule   Other Visit Diagnoses     Colon cancer screening       Relevant Orders   Ambulatory referral to Gastroenterology   Need for diphtheria-tetanus-pertussis (Tdap) vaccine       Relevant Orders   Tdap vaccine greater than or equal to 7yo IM (Completed)       I have spent 14mins with this patient regarding history taking, documentation, review of labs, specialty note-GYN and cardiology, formulating plan and discussing  treatment options with patient.  Follow-up: Return in about 1 year (around 12/26/2022) for CPE (fasting).  Wilfred Lacy, NP

## 2021-12-26 NOTE — Assessment & Plan Note (Signed)
>>  ASSESSMENT AND PLAN FOR OVERWEIGHT WRITTEN ON 12/26/2021 11:05 AM BY Itzell Bendavid LUM, NP  Lowest weight 160lb at age 61 Previous weight loss attempts:  1. Use of phentermine in past: no weight loss 2. Participated in the weight management program within [facility] 2018-2019. 220lbs to 189lbs (maintain weight of 189 x 3-85months). Stopped program due to difficulty following recommended diet and need to take care of elderly mother. 3. Intermittent fasting (16hrs fatsing and 8hrs eating)x 5months, lost 7lbs.  Current diet: heart healthy choices, unknown daily calories. No exercise routine.  We discussed the importance of incorporating exercise routine in combination with heart healthy choices and portion control. We discussed use of medications to aid with weight loss (phentermine and saxenda injection). With hx of A-fib, I cautioned about her risk of palpitations, cardiovascular/cerebrovascular event, anxiety, and insomnia. Since BMI is <30 and no risk of DM, I did not recommend use of saxenda. She verbalized understanding and agreed to start exercise routine. Provided printed information on exercise.

## 2022-01-17 IMAGING — MG MM DIGITAL SCREENING BILAT W/ TOMO AND CAD
8 series · 8 of 24 positions shown · non-contrast
Comparison: Previous exam(s).

CLINICAL DATA: Screening.

EXAM:
DIGITAL SCREENING BILATERAL MAMMOGRAM WITH TOMOSYNTHESIS AND CAD
TECHNIQUE: Bilateral screening digital craniocaudal and mediolateral oblique
mammograms were obtained. Bilateral screening digital breast
tomosynthesis was performed. The images were evaluated with
computer-aided detection.

[L CC synth-2D]
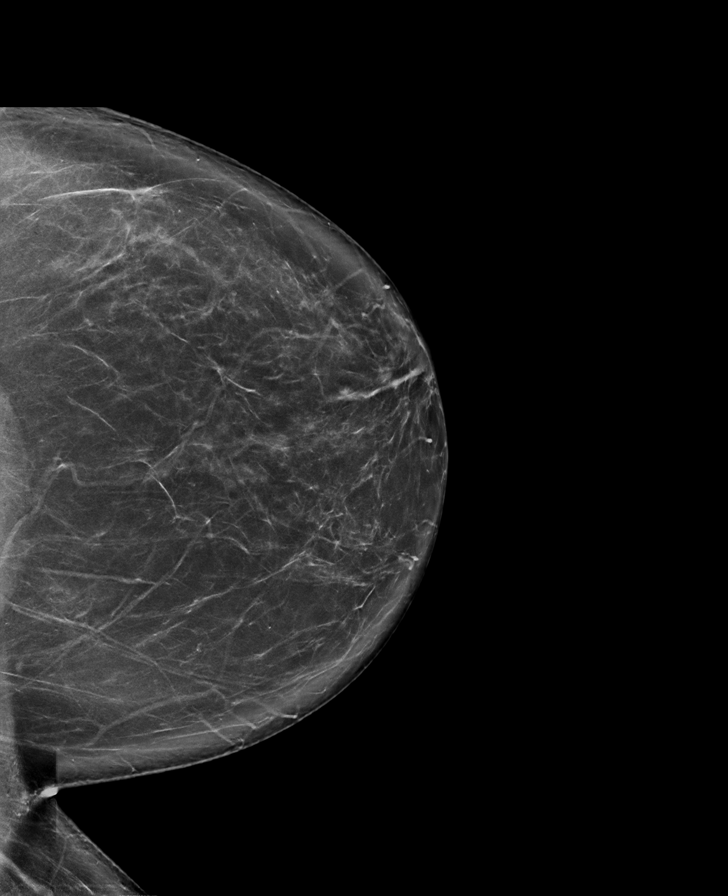

[R MLO synth-2D]
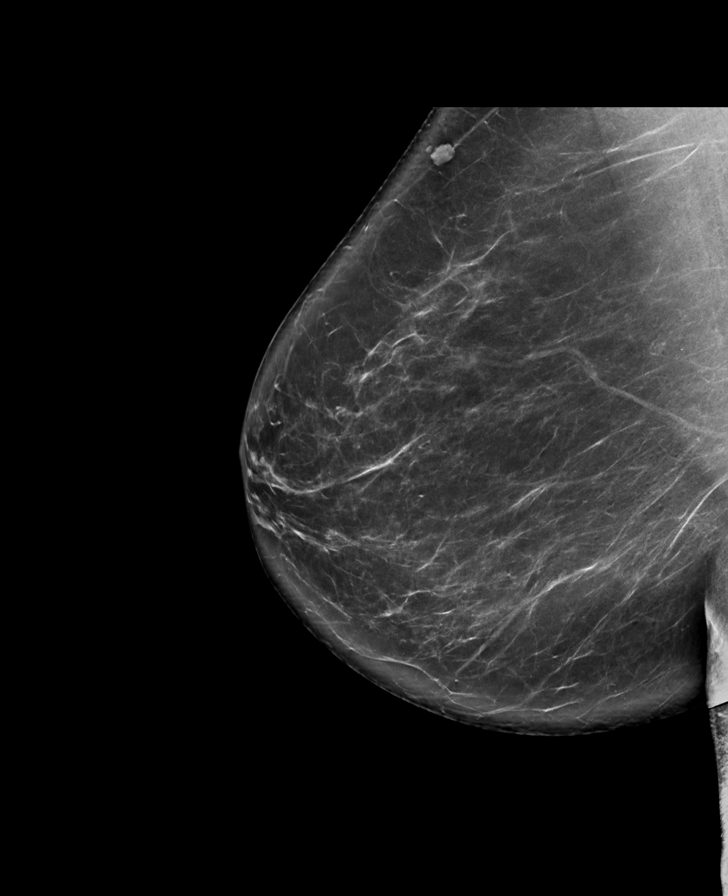

[L MLO synth-2D]
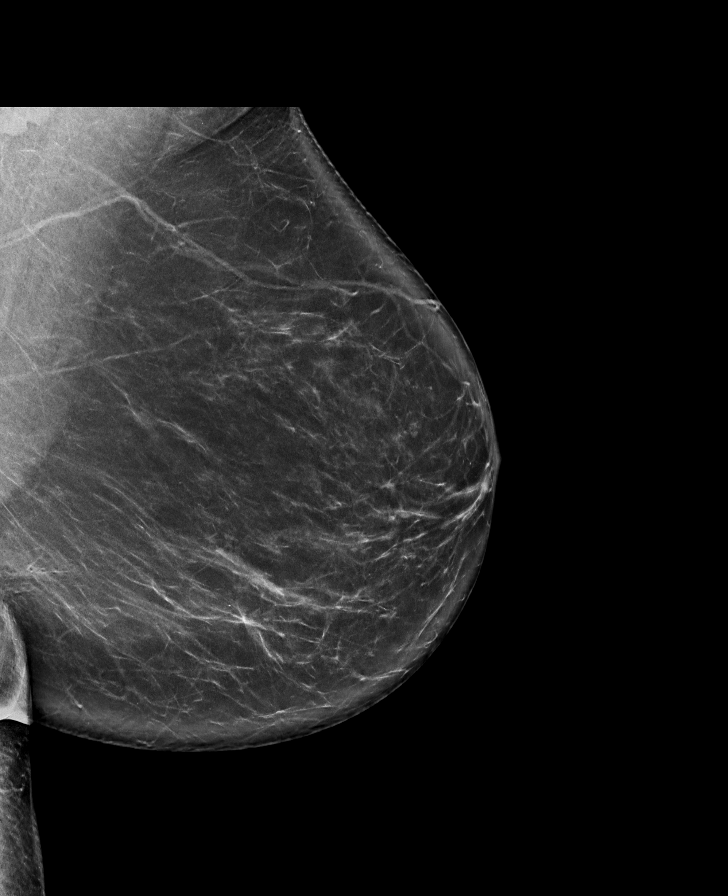

[R CC synth-2D]
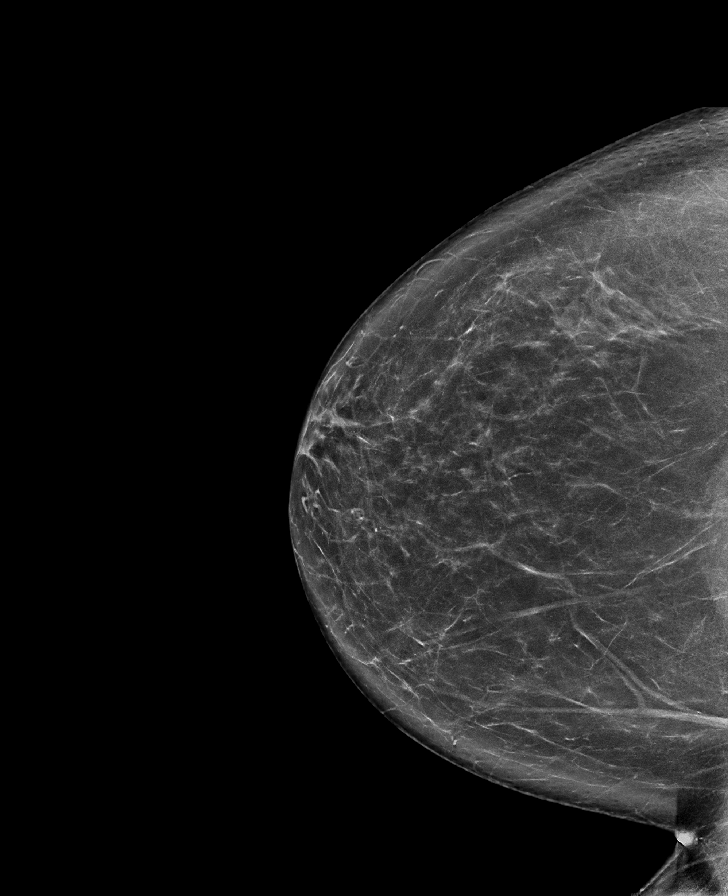

[L CC tomo · tomo slice 46/91.0]
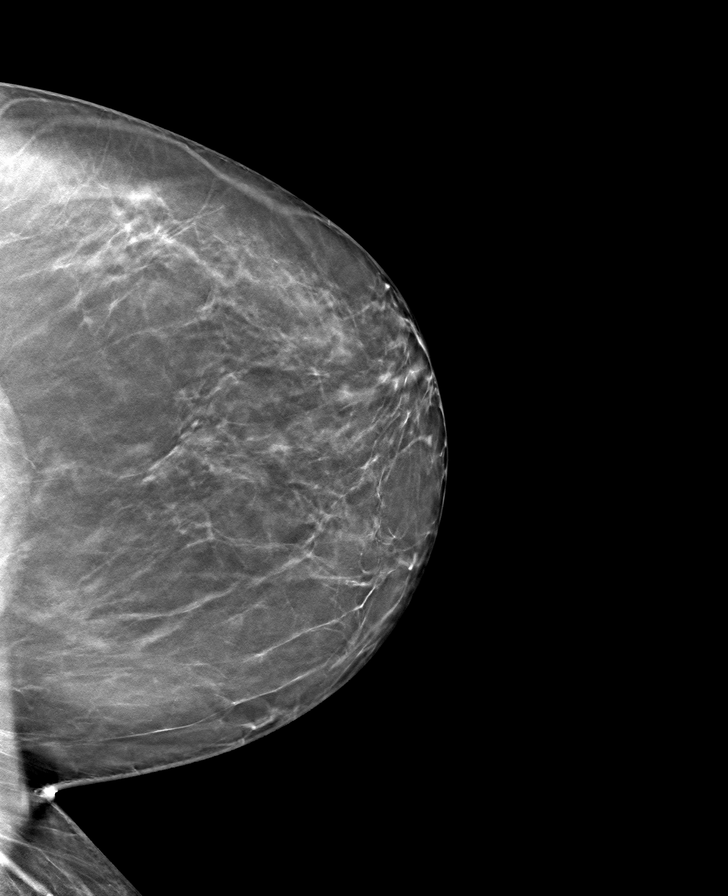

[R MLO tomo · tomo slice 49/97.0]
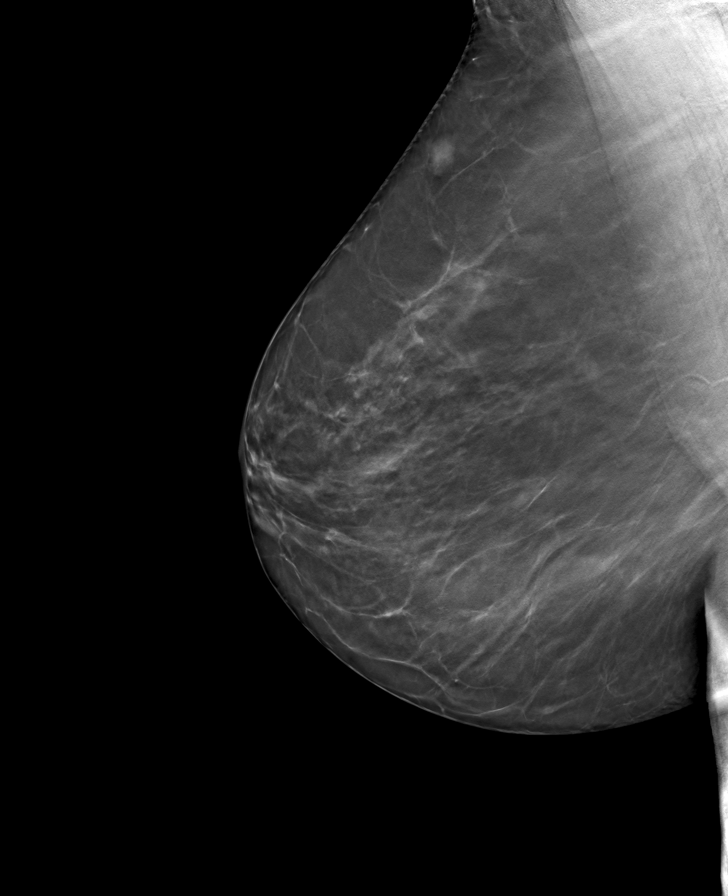

[R CC tomo · tomo slice 44/87.0]
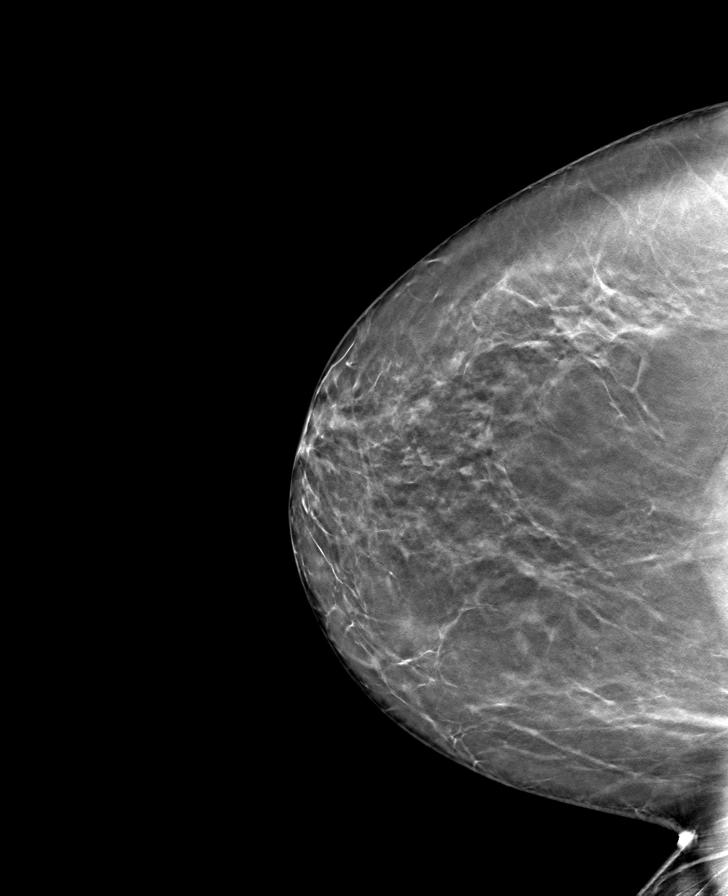

[L MLO tomo · tomo slice 49/97.0]
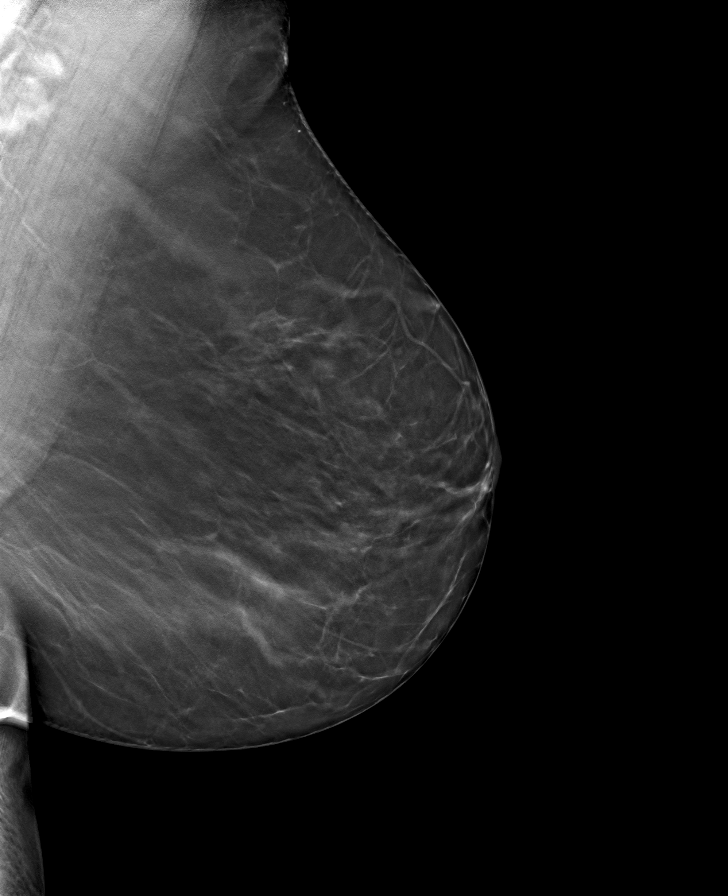

[8 of 24 positions shown; findings below may reference images not displayed]

ACR Breast Density Category b: There are scattered areas of
fibroglandular density.
FINDINGS: There are no findings suspicious for malignancy.
IMPRESSION: No mammographic evidence of malignancy. A result letter of this
screening mammogram will be mailed directly to the patient.

RECOMMENDATION:
Screening mammogram in one year. (Code:51-O-LD2)

BI-RADS CATEGORY  1: Negative.

## 2022-02-08 ENCOUNTER — Encounter: Payer: Self-pay | Admitting: Gastroenterology

## 2022-02-20 ENCOUNTER — Ambulatory Visit: Payer: 59 | Admitting: Gastroenterology

## 2022-03-05 ENCOUNTER — Encounter: Payer: Self-pay | Admitting: *Deleted

## 2022-03-05 ENCOUNTER — Encounter: Payer: Self-pay | Admitting: Cardiology

## 2022-03-05 ENCOUNTER — Ambulatory Visit: Payer: 59 | Admitting: Cardiology

## 2022-03-05 VITALS — BP 122/88 | HR 59 | Ht 71.0 in | Wt 215.0 lb

## 2022-03-05 DIAGNOSIS — I480 Paroxysmal atrial fibrillation: Secondary | ICD-10-CM | POA: Diagnosis not present

## 2022-03-05 DIAGNOSIS — Z01812 Encounter for preprocedural laboratory examination: Secondary | ICD-10-CM

## 2022-03-05 LAB — BASIC METABOLIC PANEL
BUN/Creatinine Ratio: 19 (ref 9–23)
BUN: 17 mg/dL (ref 6–24)
CO2: 22 mmol/L (ref 20–29)
Calcium: 9.8 mg/dL (ref 8.7–10.2)
Chloride: 107 mmol/L — ABNORMAL HIGH (ref 96–106)
Creatinine, Ser: 0.9 mg/dL (ref 0.57–1.00)
Glucose: 89 mg/dL (ref 70–99)
Potassium: 4.6 mmol/L (ref 3.5–5.2)
Sodium: 142 mmol/L (ref 134–144)
eGFR: 74 mL/min/{1.73_m2} (ref >59)

## 2022-03-05 NOTE — Patient Instructions (Signed)
Medication Instructions:  ?Your physician recommends that you continue on your current medications as directed. Please refer to the Current Medication list given to you today. ? ?*If you need a refill on your cardiac medications before your next appointment, please call your pharmacy* ? ? ?Lab Work: ?Today: BMET ? ?Pre procedure labs - see procedure instruction letter:  BMP & CBC ? ?If you have labs (blood work) drawn today and your tests are completely normal, you will receive your results only by: ?MyChart Message (if you have MyChart) OR ?A paper copy in the mail ?If you have any lab test that is abnormal or we need to change your treatment, we will call you to review the results. ? ? ?Testing/Procedures: ?Your physician has requested that you have cardiac CT within 7 days PRIOR to your ablation. Cardiac computed tomography (CT) is a painless test that uses an x-ray machine to take clear, detailed pictures of your heart.  Please follow instruction below located under "other instructions". ?You will get a call from our office to schedule the date for this test. ? ?Your physician has recommended that you have an ablation. Catheter ablation is a medical procedure used to treat some cardiac arrhythmias (irregular heartbeats). During catheter ablation, a long, thin, flexible tube is put into a blood vessel in your groin (upper thigh), or neck. This tube is called an ablation catheter. It is then guided to your heart through the blood vessel. Radio frequency waves destroy small areas of heart tissue where abnormal heartbeats may cause an arrhythmia to start. Please follow instruction letter given to you today. ? ? ?Follow-Up: ?At [facility], you and your health needs are our priority.  As part of our continuing mission to provide you with exceptional heart care, we have created designated Provider Care Teams.  These Care Teams include your primary Cardiologist (physician) and Advanced Practice Providers (APPs -   Physician Assistants and Nurse Practitioners) who all work together to provide you with the care you need, when you need it. ? ?Your next appointment:   ?1 month(s) after your ablation ? ?The format for your next appointment:   ?In Person ? ?Provider:   ?AFib clinic ? ? ?Thank you for choosing [facility]!! ? ? ?Trinidad Curet, RN ?[phone] ? ? ?Other Instructions ? ? ? ?Cardiac Ablation ?Cardiac ablation is a procedure to destroy (ablate) some heart tissue that is sending bad signals. These bad signals cause problems in heart rhythm. ?The heart has many areas that make these signals. If there are problems in these areas, they can make the heart beat in a way that is not normal. Destroying some tissues can help make the heart rhythm normal. ?Tell your doctor about: ?Any allergies you have. ?All medicines you are taking. These include vitamins, herbs, eye drops, creams, and over-the-counter medicines. ?Any problems you or family members have had with medicines that make you fall asleep (anesthetics). ?Any blood disorders you have. ?Any surgeries you have had. ?Any medical conditions you have, such as kidney failure. ?Whether you are pregnant or may be pregnant. ?What are the risks? ?This is a safe procedure. But problems may occur, including: ?Infection. ?Bruising and bleeding. ?Bleeding into the chest. ?Stroke or blood clots. ?Damage to nearby areas of your body. ?Allergies to medicines or dyes. ?The need for a pacemaker if the normal system is damaged. ?Failure of the procedure to treat the problem. ?What happens before the procedure? ?Medicines ?Ask your doctor about: ?Changing or stopping  your normal medicines. This is important. ?Taking aspirin and ibuprofen. Do not take these medicines unless your doctor tells you to take them. ?Taking other medicines, vitamins, herbs, and supplements. ?General instructions ?Follow instructions from your doctor about what you cannot eat or drink. ?Plan to have someone  take you home from the hospital or clinic. ?If you will be going home right after the procedure, plan to have someone with you for 24 hours. ?Ask your doctor what steps will be taken to prevent infection. ?What happens during the procedure? ? ?An IV tube will be put into one of your veins. ?You will be given a medicine to help you relax. ?The skin on your neck or groin will be numbed. ?A cut (incision) will be made in your neck or groin. A needle will be put through your cut and into a large vein. ?A tube (catheter) will be put into the needle. The tube will be moved to your heart. ?Dye may be put through the tube. This helps your doctor see your heart. ?Small devices (electrodes) on the tube will send out signals. ?A type of energy will be used to destroy some heart tissue. ?The tube will be taken out. ?Pressure will be held on your cut. This helps stop bleeding. ?A bandage will be put over your cut. ?The exact procedure may vary among doctors and hospitals. ?What happens after the procedure? ?You will be watched until you leave the hospital or clinic. This includes checking your heart rate, breathing rate, oxygen, and blood pressure. ?Your cut will be watched for bleeding. You will need to lie still for a few hours. ?Do not drive for 24 hours or as long as your doctor tells you. ?Summary ?Cardiac ablation is a procedure to destroy some heart tissue. This is done to treat heart rhythm problems. ?Tell your doctor about any medical conditions you may have. Tell him or her about all medicines you are taking to treat them. ?This is a safe procedure. But problems may occur. These include infection, bruising, bleeding, and damage to nearby areas of your body. ?Follow what your doctor tells you about food and drink. You may also be told to change or stop some of your medicines. ?After the procedure, do not drive for 24 hours or as long as your doctor tells you. ?This information is not intended to replace advice given to  you by your health care provider. Make sure you discuss any questions you have with your health care provider. ?Document Revised: 10/07/2019 Document Reviewed: 10/07/2019 ?Elsevier Patient Education ? [place]. ? ?
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
## 2022-03-06 NOTE — Addendum Note (Signed)
Addended by: Jordan Likes on: 03/06/2022 10:37 AM ? ? Modules accepted: Orders ? ?

## 2022-03-12 ENCOUNTER — Encounter: Payer: Self-pay | Admitting: Internal Medicine

## 2022-03-12 ENCOUNTER — Ambulatory Visit: Payer: 59 | Admitting: Internal Medicine

## 2022-03-12 ENCOUNTER — Other Ambulatory Visit: Payer: Self-pay | Admitting: Nurse Practitioner

## 2022-03-12 VITALS — BP 132/84 | HR 76 | Ht 71.0 in | Wt 217.0 lb

## 2022-03-12 DIAGNOSIS — Z1211 Encounter for screening for malignant neoplasm of colon: Secondary | ICD-10-CM | POA: Diagnosis not present

## 2022-03-12 DIAGNOSIS — K219 Gastro-esophageal reflux disease without esophagitis: Secondary | ICD-10-CM | POA: Diagnosis not present

## 2022-03-12 DIAGNOSIS — E559 Vitamin D deficiency, unspecified: Secondary | ICD-10-CM

## 2022-03-12 NOTE — Patient Instructions (Addendum)
You have been scheduled for a colonoscopy. Please follow written instructions given to you at your visit today.  ?Please pick up your prep supplies at the pharmacy within the next 1-3 days. ?If you use inhalers (even only as needed), please bring them with you on the day of your procedure.  ? ?If you are age 60 or older, your body mass index should be between 23-30. Your Body mass index is 30.27 kg/m?Marland Kitchen If this is out of the aforementioned range listed, please consider follow up with your Primary Care Provider. ? ?If you are age 34 or younger, your body mass index should be between 19-25. Your Body mass index is 30.27 kg/m?Marland Kitchen If this is out of the aformentioned range listed, please consider follow up with your Primary Care Provider.  ? ?________________________________________________________ ? ?The [place] [facility] providers would like to encourage you to use [facility] to communicate with providers for non-urgent requests or questions.  Due to long hold times on the telephone, sending your provider a message by [facility] may be a faster and more efficient way to get a response.  Please allow 48 business hours for a response.  Please remember that this is for non-urgent requests.  ?_______________________________________________________  ? ?Due to recent changes in healthcare laws, you may see the results of your imaging and laboratory studies on MyChart before your provider has had a chance to review them.  We understand that in some cases there may be results that are confusing or concerning to you. Not all laboratory results come back in the same time frame and the provider may be waiting for multiple results in order to interpret others.  Please give [place] 48 hours in order for your provider to thoroughly review all the results before contacting the office for clarification of your results. . ? ?Gastroesophageal Reflux Disease, Adult ?Gastroesophageal reflux (GER) happens when acid from the stomach flows up into the tube that  connects the mouth and the stomach (esophagus). Normally, food travels down the esophagus and stays in the stomach to be digested. However, when a person has GER, food and stomach acid sometimes move back up into the esophagus. If this becomes a more serious problem, the person may be diagnosed with a disease called gastroesophageal reflux disease (GERD). GERD occurs when the reflux: ?Happens often. ?Causes frequent or severe symptoms. ?Causes problems such as damage to the esophagus. ?When stomach acid comes in contact with the esophagus, the acid may cause inflammation in the esophagus. Over time, GERD may create small holes (ulcers) in the lining of the esophagus. ?What are the causes? ?This condition is caused by a problem with the muscle between the esophagus and the stomach (lower esophageal sphincter, or LES). Normally, the LES muscle closes after food passes through the esophagus to the stomach. When the LES is weakened or abnormal, it does not close properly, and that allows food and stomach acid to go back up into the esophagus. ?The LES can be weakened by certain dietary substances, medicines, and medical conditions, including: ?Tobacco use. ?Pregnancy. ?Having a hiatal hernia. ?Alcohol use. ?Certain foods and beverages, such as coffee, chocolate, onions, and peppermint. ?What increases the risk? ?You are more likely to develop this condition if you: ?Have an increased body weight. ?Have a connective tissue disorder. ?Take NSAIDs, such as ibuprofen. ?What are the signs or symptoms? ?Symptoms of this condition include: ?Heartburn. ?Difficult or painful swallowing and the feeling of having a lump in the throat. ?A bitter taste in the mouth. ?  Bad breath and having a large amount of saliva. ?Having an upset or bloated stomach and belching. ?Chest pain. Different conditions can cause chest pain. Make sure you see your health care provider if you experience chest pain. ?Shortness of breath or wheezing. ?Ongoing  (chronic) cough or a nighttime cough. ?Wearing away of tooth enamel. ?Weight loss. ?How is this diagnosed? ?This condition may be diagnosed based on a medical history and a physical exam. To determine if you have mild or severe GERD, your health care provider may also monitor how you respond to treatment. You may also have tests, including: ?A test to examine your stomach and esophagus with a small camera (endoscopy). ?A test that measures the acidity level in your esophagus. ?A test that measures how much pressure is on your esophagus. ?A barium swallow or modified barium swallow test to show the shape, size, and functioning of your esophagus. ?How is this treated? ?Treatment for this condition may vary depending on how severe your symptoms are. Your health care provider may recommend: ?Changes to your diet. ?Medicine. ?Surgery. ?The goal of treatment is to help relieve your symptoms and to prevent complications. ?Follow these instructions at home: ?Eating and drinking ? ?Follow a diet as recommended by your health care provider. This may involve avoiding foods and drinks such as: ?Coffee and tea, with or without caffeine. ?Drinks that contain alcohol. ?Energy drinks and sports drinks. ?Carbonated drinks or sodas. ?Chocolate and cocoa. ?Peppermint and mint flavorings. ?Garlic and onions. ?Horseradish. ?Spicy and acidic foods, including peppers, chili powder, curry powder, vinegar, hot sauces, and barbecue sauce. ?Citrus fruit juices and citrus fruits, such as oranges, lemons, and limes. ?Tomato-based foods, such as red sauce, chili, salsa, and pizza with red sauce. ?Fried and fatty foods, such as donuts, french fries, potato chips, and high-fat dressings. ?High-fat meats, such as hot dogs and fatty cuts of red and white meats, such as rib eye steak, sausage, ham, and bacon. ?High-fat dairy items, such as whole milk, butter, and cream cheese. ?Eat small, frequent meals instead of large meals. ?Avoid drinking large  amounts of liquid with your meals. ?Avoid eating meals during the 2-3 hours before bedtime. ?Avoid lying down right after you eat. ?Do not exercise right after you eat. ?Lifestyle ? ?Do not use any products that contain nicotine or tobacco. These products include cigarettes, chewing tobacco, and vaping devices, such as e-cigarettes. If you need help quitting, ask your health care provider. ?Try to reduce your stress by using methods such as yoga or meditation. If you need help reducing stress, ask your health care provider. ?If you are overweight, reduce your weight to an amount that is healthy for you. Ask your health care provider for guidance about a safe weight loss goal. ?General instructions ?Pay attention to any changes in your symptoms. ?Take over-the-counter and prescription medicines only as told by your health care provider. Do not take aspirin, ibuprofen, or other NSAIDs unless your health care provider told you to take these medicines. ?Wear loose-fitting clothing. Do not wear anything tight around your waist that causes pressure on your abdomen. ?Raise (elevate) the head of your bed about 6 inches (15 cm). You can use a wedge to do this. ?Avoid bending over if this makes your symptoms worse. ?Keep all follow-up visits. This is important. ?Contact a health care provider if: ?You have: ?New symptoms. ?Unexplained weight loss. ?Difficulty swallowing or it hurts to swallow. ?Wheezing or a persistent cough. ?A hoarse voice. ?Your symptoms do not  improve with treatment. ?Get help right away if: ?You have sudden pain in your arms, neck, jaw, teeth, or back. ?You suddenly feel sweaty, dizzy, or light-headed. ?You have chest pain or shortness of breath. ?You vomit and the vomit is green, yellow, or black, or it looks like blood or coffee grounds. ?You faint. ?You have stool that is red, bloody, or black. ?You cannot swallow, drink, or eat. ?These symptoms may represent a serious problem that is an emergency. Do  not wait to see if the symptoms will go away. Get medical help right away. Call your local emergency services (911 in the [place] Do not drive yourself to the hospital. ?Summary ?Gastroesophageal refl
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
## 2022-03-19 ENCOUNTER — Encounter: Payer: Self-pay | Admitting: Internal Medicine

## 2022-03-26 ENCOUNTER — Encounter: Payer: Self-pay | Admitting: Internal Medicine

## 2022-03-26 ENCOUNTER — Ambulatory Visit (AMBULATORY_SURGERY_CENTER): Payer: 59 | Admitting: Internal Medicine

## 2022-03-26 VITALS — BP 109/71 | HR 57 | Temp 97.1°F | Resp 14 | Ht 71.0 in | Wt 217.0 lb

## 2022-03-26 DIAGNOSIS — Z1211 Encounter for screening for malignant neoplasm of colon: Secondary | ICD-10-CM | POA: Diagnosis present

## 2022-03-26 DIAGNOSIS — D123 Benign neoplasm of transverse colon: Secondary | ICD-10-CM | POA: Diagnosis not present

## 2022-03-26 DIAGNOSIS — D125 Benign neoplasm of sigmoid colon: Secondary | ICD-10-CM

## 2022-03-26 MED ORDER — SODIUM CHLORIDE 0.9 % IV SOLN: 500.0000 mL | Freq: Once | INTRAVENOUS | Status: DC

## 2022-03-26 NOTE — Progress Notes (Signed)
Pt's states no medical or surgical changes since previsit or office visit. VS assessed by C.W 

## 2022-03-26 NOTE — Patient Instructions (Signed)
Discharge instructions given. Handout on polyps. Resume previous medications. YOU HAD AN ENDOSCOPIC PROCEDURE TODAY AT THE [facility]:   Refer to the procedure report that was given to you for any specific questions about what was found during the examination.  If the procedure report does not answer your questions, please call your gastroenterologist to clarify.  If you requested that your care partner not be given the details of your procedure findings, then the procedure report has been included in a sealed envelope for you to review at your convenience later.  YOU SHOULD EXPECT: Some feelings of bloating in the abdomen. Passage of more gas than usual.  Walking can help get rid of the air that was put into your GI tract during the procedure and reduce the bloating. If you had a lower endoscopy (such as a colonoscopy or flexible sigmoidoscopy) you may notice spotting of blood in your stool or on the toilet paper. If you underwent a bowel prep for your procedure, you may not have a normal bowel movement for a few days.  Please Note:  You might notice some irritation and congestion in your nose or some drainage.  This is from the oxygen used during your procedure.  There is no need for concern and it should clear up in a day or so.  SYMPTOMS TO REPORT IMMEDIATELY:  Following lower endoscopy (colonoscopy or flexible sigmoidoscopy):  Excessive amounts of blood in the stool  Significant tenderness or worsening of abdominal pains  Swelling of the abdomen that is new, acute  Fever of 100F or higher   For urgent or emergent issues, a gastroenterologist can be reached at any hour by calling [phone]. Do not use MyChart messaging for urgent concerns.    DIET:  We do recommend a small meal at first, but then you may proceed to your regular diet.  Drink plenty of fluids but you should avoid alcoholic beverages for 24 hours.  ACTIVITY:  You should plan to take it easy for the rest  of today and you should NOT DRIVE or use heavy machinery until tomorrow (because of the sedation medicines used during the test).    FOLLOW UP: Our staff will call the number listed on your records 48-72 hours following your procedure to check on you and address any questions or concerns that you may have regarding the information given to you following your procedure. If we do not reach you, we will leave a message.  We will attempt to reach you two times.  During this call, we will ask if you have developed any symptoms of COVID 19. If you develop any symptoms (ie: fever, flu-like symptoms, shortness of breath, cough etc.) before then, please call [phone].  If you test positive for Covid 19 in the 2 weeks post procedure, please call and report this information to us.    If any biopsies were taken you will be contacted by phone or by letter within the next 1-3 weeks.  Please call us at [phone] if you have not heard about the biopsies in 3 weeks.    SIGNATURES/CONFIDENTIALITY: You and/or your care partner have signed paperwork which will be entered into your electronic medical record.  These signatures attest to the fact that that the information above on your After Visit Summary has been reviewed and is understood.  Full responsibility of the confidentiality of this discharge information lies with you and/or your care-partner.  
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
## 2022-03-26 NOTE — Op Note (Signed)
Lindsey Bishop ?Patient Name: Lindsey Bishop ?Procedure Date: 03/26/2022 11:05 AM ?MRN: [id number] ?Endoscopist: Sonny Masters "Christia Reading ,  ?Age: 61 ?Referring MD:  ?Date of Birth: [birth year] ?Gender: Female ?Account #: [id number] ?Procedure:                Colonoscopy ?Indications:              Screening for colorectal malignant neoplasm ?Medicines:                Monitored Anesthesia Care ?Procedure:                Pre-Anesthesia Assessment: ?                          - Prior to the procedure, a History and Physical  ?                          was performed, and patient medications and  ?                          allergies were reviewed. [name] tolerance of  ?                          previous anesthesia was also reviewed. The risks  ?                          and benefits of the procedure and the sedation  ?                          options and risks were discussed with the patient.  ?                          All questions were answered, and informed consent  ?                          was obtained. Prior Anticoagulants: The patient has  ?                          taken no previous anticoagulant or antiplatelet  ?                          agents. ASA Grade Assessment: II - A patient with  ?                          mild systemic disease. After reviewing the risks  ?                          and benefits, the patient was deemed in  ?                          satisfactory condition to undergo the procedure. ?                          After obtaining informed consent, the colonoscope  ?  was passed under direct vision. Throughout the  ?                          procedure, the patient's blood pressure, pulse, and  ?                          oxygen saturations were monitored continuously. The  ?                          Olympus CF-[id number] (#[id number]) Colonoscope was  ?                          introduced through the anus and advanced to the the  ?                          terminal ileum.  The colonoscopy was performed  ?                          without difficulty. The patient tolerated the  ?                          procedure well. The quality of the bowel  ?                          preparation was adequate. The terminal ileum,  ?                          ileocecal valve, appendiceal orifice, and rectum  ?                          were photographed. ?Scope In: 11:10:00 AM ?Scope Out: 11:42:04 AM ?Scope Withdrawal Time: 0 hours 25 minutes 42 seconds  ?Total Procedure Duration: 0 hours 32 minutes 4 seconds  ?Findings:                 Two sessile polyps were found in the transverse  ?                          colon. The polyps were 3 to 4 mm in size. These  ?                          polyps were removed with a cold snare. Resection  ?                          and retrieval were complete. ?                          Two sessile polyps were found in the sigmoid colon.  ?                          The polyps were 3 to 4 mm in size. These polyps  ?                          were removed with a cold  snare. Resection and  ?                          retrieval were complete. ?                          Non-bleeding internal hemorrhoids were found during  ?                          retroflexion. ?Complications:            No immediate complications. ?Estimated Blood Loss:     Estimated blood loss was minimal. ?Impression:               - Two 3 to 4 mm polyps in the transverse colon,  ?                          removed with a cold snare. Resected and retrieved. ?                          - Two 3 to 4 mm polyps in the sigmoid colon,  ?                          removed with a cold snare. Resected and retrieved. ?                          - Non-bleeding internal hemorrhoids. ?Recommendation:           - Discharge patient to home (with escort). ?                          - Await pathology results. ?                          - The findings and recommendations were discussed  ?                          with the  patient. ?Georgian Co,  ?03/26/2022 12:02:28 PM ?

## 2022-03-26 NOTE — Progress Notes (Signed)
To pacu, VSS. Report to [email or web address].tb 

## 2022-03-26 NOTE — Progress Notes (Signed)
Called to room to assist during endoscopic procedure.  Patient ID and intended procedure confirmed with present staff. Received instructions for my participation in the procedure from the performing physician.  

## 2022-03-28 ENCOUNTER — Telehealth: Payer: Self-pay

## 2022-03-28 ENCOUNTER — Encounter: Payer: Self-pay | Admitting: Internal Medicine

## 2022-03-28 NOTE — Telephone Encounter (Signed)
?  Follow up Call- ? ? ?  03/26/2022  ? 10:23 AM  ?Call back number  ?Post procedure Call Back phone  # [phone]  ?Permission to leave phone message Yes  ?  ? ?Patient questions: ? ?Do you have a fever, pain , or abdominal swelling? No. ?Pain Score  0 * ? ?Have you tolerated food without any problems? Yes.   ? ?Have you been able to return to your normal activities? Yes.   ? ?Do you have any questions about your discharge instructions: ?Diet   No. ?Medications  No. ?Follow up visit  No. ? ?Do you have questions or concerns about your Care? No. ? ?Actions: ?* If pain score is 4 or above: ?No action needed, pain <4. ? ? ?

## 2022-04-19 ENCOUNTER — Telehealth: Payer: Self-pay | Admitting: Cardiology

## 2022-04-19 ENCOUNTER — Encounter: Payer: Self-pay | Admitting: Cardiology

## 2022-04-19 DIAGNOSIS — I480 Paroxysmal atrial fibrillation: Secondary | ICD-10-CM

## 2022-04-19 DIAGNOSIS — Z01812 Encounter for preprocedural laboratory examination: Secondary | ICD-10-CM

## 2022-04-19 NOTE — Telephone Encounter (Signed)
Pt c/o medication issue:  1. Name of Medication:    2. How are you currently taking this medication (dosage and times per day)?    3. Are you having a reaction (difficulty breathing--STAT)? no  4. What is your medication issue? Patient calling in bout medication she is suppose start taking 3 weeks before her procedure. She states she dont have a prescription for that.

## 2022-04-19 NOTE — Telephone Encounter (Signed)
Left message to call back Aware will follow up next week to get her started on [facility]

## 2022-04-22 ENCOUNTER — Encounter: Payer: Self-pay | Admitting: *Deleted

## 2022-04-22 MED ORDER — RIVAROXABAN 20 MG PO TABS: 20.0000 mg | ORAL_TABLET | Freq: Every day | ORAL | 6 refills | Status: DC

## 2022-04-22 NOTE — Telephone Encounter (Signed)
Pt will stop by the [place] office Thursday/Friday for pre procedure blood work and to pick up Xarelto  30 day/$10 month cards. Aware instructions will be sent via mychart. She will let me know if she has any questions after reviewing instructions.

## 2022-04-29 LAB — BASIC METABOLIC PANEL
BUN/Creatinine Ratio: 17 (ref 9–23)
BUN: 16 mg/dL (ref 6–24)
CO2: 26 mmol/L (ref 20–29)
Calcium: 9.7 mg/dL (ref 8.7–10.2)
Chloride: 106 mmol/L (ref 96–106)
Creatinine, Ser: 0.92 mg/dL (ref 0.57–1.00)
Glucose: 83 mg/dL (ref 70–99)
Potassium: 4.7 mmol/L (ref 3.5–5.2)
Sodium: 140 mmol/L (ref 134–144)
eGFR: 72 mL/min/{1.73_m2} (ref >59)

## 2022-04-29 LAB — CBC
Hematocrit: 37.8 % (ref 34.0–46.6)
Hemoglobin: 12.8 g/dL (ref 11.1–15.9)
MCH: 30.2 pg (ref 26.6–33.0)
MCHC: 33.9 g/dL (ref 31.5–35.7)
MCV: 89 fL (ref 79–97)
Platelets: 209 10*3/uL (ref 150–450)
RBC: 4.24 x10E6/uL (ref 3.77–5.28)
RDW: 12.8 % (ref 11.7–15.4)
WBC: 6.1 10*3/uL (ref 3.4–10.8)

## 2022-04-30 ENCOUNTER — Encounter (HOSPITAL_COMMUNITY): Payer: Self-pay

## 2022-05-10 ENCOUNTER — Telehealth: Payer: Self-pay | Admitting: Cardiology

## 2022-05-10 DIAGNOSIS — Z0279 Encounter for issue of other medical certificate: Secondary | ICD-10-CM

## 2022-05-10 NOTE — Telephone Encounter (Signed)
Form and payment received from patient. Form placed in Camnitz's box

## 2022-05-15 ENCOUNTER — Telehealth (HOSPITAL_COMMUNITY): Payer: Self-pay | Admitting: *Deleted

## 2022-05-15 NOTE — Telephone Encounter (Signed)
Reaching out to patient to offer assistance regarding upcoming cardiac imaging study; pt verbalizes understanding of appt date/time, parking situation and where to check in, pre-test NPO status  and verified current allergies; name and call back number provided for further questions should they arise  Augusta Mirkin RN Navigator Cardiac [facility] [phone] office [phone] cell  Patient aware to arrive at 1pm. 

## 2022-05-16 ENCOUNTER — Ambulatory Visit (HOSPITAL_COMMUNITY)
Admission: RE | Admit: 2022-05-16 | Discharge: 2022-05-16 | Disposition: A | Discharge status: Home / Self Care | Payer: 59 | Source: Ambulatory Visit | Attending: Cardiology | Admitting: Cardiology

## 2022-05-16 DIAGNOSIS — I480 Paroxysmal atrial fibrillation: Secondary | ICD-10-CM | POA: Diagnosis present

## 2022-05-16 MED ORDER — IOHEXOL 350 MG/ML SOLN
100.0000 mL | Freq: Once | INTRAVENOUS | Status: AC | PRN
  Administered 2022-05-16: 100 mL via INTRAVENOUS

## 2022-05-17 NOTE — Telephone Encounter (Signed)
Forms were received back from doctor and faxed to [phone]. Patient was called to pick up of forms. Left at front desk.

## 2022-05-23 NOTE — Pre-Procedure Instructions (Signed)
Instructed patient on the following items: Arrival time 1130 Nothing to eat or drink after midnight No meds AM of procedure Responsible person to drive you home and stay with you for 24 hrs  Have you missed any doses of anti-coagulant Xarelto- hasn't missed any doses

## 2022-05-23 NOTE — Anesthesia Preprocedure Evaluation (Addendum)
Anesthesia Evaluation  Patient identified by MRN, date of birth, ID band Patient awake    Reviewed: Allergy & Precautions, NPO status , Patient's Chart, lab work & pertinent test results  Airway Mallampati: II  TM Distance: >3 FB Neck ROM: Full    Dental no notable dental hx. (+) Teeth Intact, Dental Advisory Given   Pulmonary    Pulmonary exam normal breath sounds clear to auscultation       Cardiovascular Normal cardiovascular exam+ dysrhythmias Atrial Fibrillation  Rhythm:Irregular Rate:Normal     Neuro/Psych    GI/Hepatic negative GI ROS, Neg liver ROS,   Endo/Other    Renal/GU Lab Results      Component                Value               Date                      CREATININE               0.92                04/29/2022                K                        4.7                 04/29/2022                    Musculoskeletal   Abdominal   Peds  Hematology Lab Results      Component                Value               Date                         HGB                      12.8                04/29/2022                   PLT                      209                 04/29/2022              Anesthesia Other Findings ALL: Latex, Tylenol, Meperidine, Multac  Reproductive/Obstetrics                            Anesthesia Physical Anesthesia Plan  ASA: 2  Anesthesia Plan: General   Post-op Pain Management:    Induction: Intravenous  PONV Risk Score and Plan: 3 and Midazolam, Dexamethasone and Ondansetron  Airway Management Planned: Oral ETT  Additional Equipment: None  Intra-op Plan:   Post-operative Plan: Extubation in OR  Informed Consent: I have reviewed the patients History and Physical, chart, labs and discussed the procedure including the risks, benefits and alternatives for the proposed anesthesia with the patient or authorized representative who has indicated his/her  understanding and acceptance.     Dental advisory given  Plan  Discussed with: CRNA and Anesthesiologist  Anesthesia Plan Comments:        Anesthesia Quick Evaluation

## 2022-05-24 ENCOUNTER — Ambulatory Visit (HOSPITAL_COMMUNITY)
Admission: RE | Admit: 2022-05-24 | Discharge: 2022-05-24 | Disposition: A | Discharge status: Home / Self Care | Payer: 59 | Attending: Cardiology | Admitting: Cardiology

## 2022-05-24 ENCOUNTER — Ambulatory Visit (HOSPITAL_BASED_OUTPATIENT_CLINIC_OR_DEPARTMENT_OTHER): Payer: 59 | Admitting: Certified Registered Nurse Anesthetist

## 2022-05-24 ENCOUNTER — Ambulatory Visit (HOSPITAL_COMMUNITY): Payer: 59 | Admitting: Certified Registered Nurse Anesthetist

## 2022-05-24 ENCOUNTER — Ambulatory Visit (HOSPITAL_COMMUNITY)
Admission: RE | Disposition: A | Discharge status: Home / Self Care | Payer: 59 | Source: Home / Self Care | Attending: Cardiology

## 2022-05-24 ENCOUNTER — Other Ambulatory Visit: Payer: Self-pay

## 2022-05-24 DIAGNOSIS — I480 Paroxysmal atrial fibrillation: Secondary | ICD-10-CM | POA: Insufficient documentation

## 2022-05-24 DIAGNOSIS — E669 Obesity, unspecified: Secondary | ICD-10-CM | POA: Diagnosis not present

## 2022-05-24 DIAGNOSIS — I471 Supraventricular tachycardia: Secondary | ICD-10-CM | POA: Insufficient documentation

## 2022-05-24 DIAGNOSIS — I4891 Unspecified atrial fibrillation: Secondary | ICD-10-CM | POA: Diagnosis not present

## 2022-05-24 DIAGNOSIS — Z6829 Body mass index (BMI) 29.0-29.9, adult: Secondary | ICD-10-CM | POA: Insufficient documentation

## 2022-05-24 HISTORY — PX: ATRIAL FIBRILLATION ABLATION: EP1191

## 2022-05-24 SURGERY — ATRIAL FIBRILLATION ABLATION
Anesthesia: General

## 2022-05-24 MED ORDER — DEXAMETHASONE SODIUM PHOSPHATE 10 MG/ML IJ SOLN
INTRAMUSCULAR | Status: DC | PRN
  Administered 2022-05-24: 5 mg via INTRAVENOUS

## 2022-05-24 MED ORDER — DOBUTAMINE INFUSION FOR EP/ECHO/NUC (1000 MCG/ML)
INTRAVENOUS | Status: DC | PRN
  Administered 2022-05-24: 10 ug/kg/min via INTRAVENOUS

## 2022-05-24 MED ORDER — ONDANSETRON HCL 4 MG/2ML IJ SOLN
INTRAMUSCULAR | Status: DC | PRN
  Administered 2022-05-24: 4 mg via INTRAVENOUS

## 2022-05-24 MED ORDER — HEPARIN SODIUM (PORCINE) 1000 UNIT/ML IJ SOLN
INTRAMUSCULAR | Status: DC | PRN
  Administered 2022-05-24: 14000 [IU] via INTRAVENOUS
  Administered 2022-05-24: 2000 [IU] via INTRAVENOUS
  Administered 2022-05-24: 3000 [IU] via INTRAVENOUS

## 2022-05-24 MED ORDER — HEPARIN SODIUM (PORCINE) 1000 UNIT/ML IJ SOLN
INTRAMUSCULAR | Status: DC | PRN
  Administered 2022-05-24: 1000 [IU] via INTRAVENOUS

## 2022-05-24 MED ORDER — PROTAMINE SULFATE 10 MG/ML IV SOLN
INTRAVENOUS | Status: DC | PRN
  Administered 2022-05-24: 40 mg via INTRAVENOUS

## 2022-05-24 MED ORDER — MIDAZOLAM HCL 2 MG/2ML IJ SOLN
INTRAMUSCULAR | Status: DC | PRN
  Administered 2022-05-24: 2 mg via INTRAVENOUS

## 2022-05-24 MED ORDER — ROCURONIUM BROMIDE 10 MG/ML (PF) SYRINGE
PREFILLED_SYRINGE | INTRAVENOUS | Status: DC | PRN
  Administered 2022-05-24: 80 mg via INTRAVENOUS

## 2022-05-24 MED ORDER — PROPOFOL 10 MG/ML IV BOLUS
INTRAVENOUS | Status: DC | PRN
  Administered 2022-05-24: 130 mg via INTRAVENOUS

## 2022-05-24 MED ORDER — HEPARIN (PORCINE) IN NACL 1000-0.9 UT/500ML-% IV SOLN
INTRAVENOUS | Status: AC
  Filled 2022-05-24: qty 500

## 2022-05-24 MED ORDER — SODIUM CHLORIDE 0.9% FLUSH: 3.0000 mL | INTRAVENOUS | Status: DC | PRN

## 2022-05-24 MED ORDER — FENTANYL CITRATE (PF) 100 MCG/2ML IJ SOLN
INTRAMUSCULAR | Status: AC
  Filled 2022-05-24: qty 2

## 2022-05-24 MED ORDER — LIDOCAINE 2% (20 MG/ML) 5 ML SYRINGE
INTRAMUSCULAR | Status: DC | PRN
  Administered 2022-05-24: 100 mg via INTRAVENOUS

## 2022-05-24 MED ORDER — DOBUTAMINE INFUSION FOR EP/ECHO/NUC (1000 MCG/ML)
INTRAVENOUS | Status: AC
Start: 2022-05-24 — End: ?
  Filled 2022-05-24: qty 250

## 2022-05-24 MED ORDER — FENTANYL CITRATE (PF) 100 MCG/2ML IJ SOLN
25.0000 ug | Freq: Once | INTRAMUSCULAR | Status: AC
  Administered 2022-05-24: 25 ug via INTRAVENOUS

## 2022-05-24 MED ORDER — HEPARIN SODIUM (PORCINE) 1000 UNIT/ML IJ SOLN
INTRAMUSCULAR | Status: AC
Start: 2022-05-24 — End: ?
  Filled 2022-05-24: qty 10

## 2022-05-24 MED ORDER — SODIUM CHLORIDE 0.9 % IV SOLN: 250.0000 mL | INTRAVENOUS | Status: DC | PRN

## 2022-05-24 MED ORDER — HEPARIN (PORCINE) IN NACL 1000-0.9 UT/500ML-% IV SOLN
INTRAVENOUS | Status: AC
Start: 2022-05-24 — End: ?
  Filled 2022-05-24: qty 500

## 2022-05-24 MED ORDER — SUGAMMADEX SODIUM 200 MG/2ML IV SOLN
INTRAVENOUS | Status: DC | PRN
  Administered 2022-05-24: 200 mg via INTRAVENOUS

## 2022-05-24 MED ORDER — SODIUM CHLORIDE 0.9 % IV SOLN: INTRAVENOUS | Status: DC

## 2022-05-24 MED ORDER — HEPARIN (PORCINE) IN NACL 1000-0.9 UT/500ML-% IV SOLN
INTRAVENOUS | Status: DC | PRN
  Administered 2022-05-24 (×3): 500 mL

## 2022-05-24 MED ORDER — ONDANSETRON HCL 4 MG/2ML IJ SOLN: 4.0000 mg | Freq: Four times a day (QID) | INTRAMUSCULAR | Status: DC | PRN

## 2022-05-24 MED ORDER — FENTANYL CITRATE (PF) 100 MCG/2ML IJ SOLN
INTRAMUSCULAR | Status: DC | PRN
Start: 2022-05-24 — End: 2022-05-24
  Administered 2022-05-24: 100 ug via INTRAVENOUS

## 2022-05-24 MED ORDER — PHENYLEPHRINE HCL-NACL 20-0.9 MG/250ML-% IV SOLN
INTRAVENOUS | Status: DC | PRN
  Administered 2022-05-24: 15 ug/min via INTRAVENOUS

## 2022-05-24 SURGICAL SUPPLY — 21 items
BAG SNAP BAND KOVER 36X36 (MISCELLANEOUS) ×1 IMPLANT
BLANKET WARM UNDERBOD FULL ACC (MISCELLANEOUS) ×2 IMPLANT
CATH 8FR REPROCESSED SOUNDSTAR (CATHETERS) ×2 IMPLANT
CATH 8FR SOUNDSTAR REPROCESSED (CATHETERS) IMPLANT
CATH ABLAT QDOT MICRO BI TC DF (CATHETERS) ×1 IMPLANT
CATH OCTARAY 2.0 F 3-3-3-3-3 (CATHETERS) ×1 IMPLANT
CATH S-M CIRCA TEMP PROBE (CATHETERS) ×1 IMPLANT
CATH WEB BI DIR CSDF CRV REPRO (CATHETERS) ×1 IMPLANT
CLOSURE PERCLOSE PROSTYLE (VASCULAR PRODUCTS) ×4 IMPLANT
COVER SWIFTLINK CONNECTOR (BAG) ×2 IMPLANT
KIT VERSACROSS STEERABLE D1 (CATHETERS) ×1 IMPLANT
PACK EP LATEX FREE (CUSTOM PROCEDURE TRAY) ×2
PACK EP LF (CUSTOM PROCEDURE TRAY) ×1 IMPLANT
PAD DEFIB RADIO PHYSIO CONN (PAD) ×2 IMPLANT
PATCH CARTO3 (PAD) ×1 IMPLANT
SHEATH CARTO VIZIGO SM CVD (SHEATH) ×1 IMPLANT
SHEATH PINNACLE 7F 10CM (SHEATH) ×1 IMPLANT
SHEATH PINNACLE 8F 10CM (SHEATH) ×2 IMPLANT
SHEATH PINNACLE 9F 10CM (SHEATH) ×1 IMPLANT
SHEATH PROBE COVER 6X72 (BAG) ×1 IMPLANT
TUBING SMART ABLATE COOLFLOW (TUBING) ×1 IMPLANT

## 2022-05-24 NOTE — Anesthesia Procedure Notes (Signed)
Procedure Name: Intubation Date/Time: 05/24/2022 1:51 PM  Performed by: Thelma Comp, CRNAPre-anesthesia Checklist: Patient identified, Emergency Drugs available, Suction available and Patient being monitored Patient Re-evaluated:Patient Re-evaluated prior to induction Oxygen Delivery Method: Circle System Utilized Preoxygenation: Pre-oxygenation with 100% oxygen Induction Type: IV induction Ventilation: Mask ventilation without difficulty Laryngoscope Size: Mac and 4 Grade View: Grade I Tube type: Oral Tube size: 7.0 mm Number of attempts: 1 Airway Equipment and Method: Stylet and Oral airway Placement Confirmation: ETT inserted through vocal cords under direct vision, positive ETCO2 and breath sounds checked- equal and bilateral Secured at: 21 cm Tube secured with: Tape Dental Injury: Teeth and Oropharynx as per pre-operative assessment

## 2022-05-24 NOTE — Transfer of Care (Signed)
Immediate Anesthesia Transfer of Care Note  Patient: Lindsey Bishop  Procedure(s) Performed: ATRIAL FIBRILLATION ABLATION  Patient Location: Cath Lab  Anesthesia Type:General  Level of Consciousness: awake, alert  and oriented  Airway & Oxygen Therapy: Patient Spontanous Breathing and Patient connected to nasal cannula oxygen  Post-op Assessment: Report given to RN and Post -op Vital signs reviewed and stable  Post vital signs: Reviewed and stable  Last Vitals:  Vitals Value Taken Time  BP 114/69 05/24/22 1535  Temp    Pulse 68 05/24/22 1536  Resp 13 05/24/22 1536  SpO2 99 % 05/24/22 1536  Vitals shown include unvalidated device data.  Last Pain:  Vitals:   05/24/22 1251  TempSrc: Oral         Complications: There were no known notable events for this encounter.
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
## 2022-05-24 NOTE — Anesthesia Postprocedure Evaluation (Signed)
Anesthesia Post Note  Patient: Lindsey Bishop  Procedure(s) Performed: ATRIAL FIBRILLATION ABLATION     Patient location during evaluation: PACU Anesthesia Type: General Level of consciousness: awake and alert Pain management: pain level controlled Vital Signs Assessment: post-procedure vital signs reviewed and stable Respiratory status: spontaneous breathing, nonlabored ventilation, respiratory function stable and patient connected to nasal cannula oxygen Cardiovascular status: blood pressure returned to baseline and stable Postop Assessment: no apparent nausea or vomiting Anesthetic complications: no   There were no known notable events for this encounter.  Last Vitals:  Vitals:   05/24/22 1536 05/24/22 1540  BP:  123/72  Pulse:  69  Resp:  18  Temp: 36.6 C   SpO2:  100%    Last Pain:  Vitals:   05/24/22 1536  TempSrc: Temporal  PainSc: 0-No pain                 Barnet Glasgow

## 2022-05-24 NOTE — Discharge Instructions (Addendum)
Post procedure care instructions No driving for 4 days. No lifting over 5 lbs for 1 week. No vigorous or sexual activity for 1 week. You may return to work/your usual activities on 06/01/22. Keep procedure site clean & dry. If you notice increased pain, swelling, bleeding or pus, call/return!  You may shower after 24 hours, but no soaking in baths/hot tubs/pools for 1 week.    You have an appointment set up with the [facility].  Multiple studies have shown that being followed by a dedicated atrial fibrillation clinic in addition to the standard care you receive from your other physicians improves health. We believe that enrollment in the atrial fibrillation clinic will allow [place] to better care for you.   The phone number to the [facility] is [phone]. The clinic is staffed Monday through Friday from 8:30am to 5pm.  Parking Directions: The clinic is located in the Heart and Vascular Building connected to [facility]. 1)From [address] turn on to [address] and go to the 3rd entrance  (Heart and Vascular entrance) on the right. 2)Look to the right for Heart &Vascular Parking Garage. 3)A code for the entrance is required, for August is 1403.   4)Take the elevators to the 1st floor. Registration is in the room with the glass walls at the end of the hallway.  If you have any trouble parking or locating the clinic, please don't hesitate to call [phone].  Cardiac Ablation, Care After  This sheet gives you information about how to care for yourself after your procedure. Your health care provider may also give you more specific instructions. If you have problems or questions, contact your health care provider. What can I expect after the procedure? After the procedure, it is common to have: Bruising around your puncture site. Tenderness around your puncture site. Skipped heartbeats. Tiredness (fatigue).  Follow these instructions at  home: Puncture site care  Follow instructions from your health care provider about how to take care of your puncture site. Make sure you: If present, leave stitches (sutures), skin glue, or adhesive strips in place. These skin closures may need to stay in place for up to 2 weeks. If adhesive strip edges start to loosen and curl up, you may trim the loose edges. Do not remove adhesive strips completely unless your health care provider tells you to do that. If a large square bandage is present, this may be removed 24 hours after surgery.  Check your puncture site every day for signs of infection. Check for: Redness, swelling, or pain. Fluid or blood. If your puncture site starts to bleed, lie down on your back, apply firm pressure to the area, and contact your health care provider. Warmth. Pus or a bad smell. A pea or small marble sized lump at the site is normal and can take up to three months to resolve.  Driving Do not drive for at least 4 days after your procedure or however long your health care provider recommends. (Do not resume driving if you have previously been instructed not to drive for other health reasons.) Do not drive or use heavy machinery while taking prescription pain medicine. Activity Avoid activities that take a lot of effort for at least 7 days after your procedure. Do not lift anything that is heavier than 5 lb (4.5 kg) for one week.  No sexual activity for 1 week.  Return to your normal activities as told by your health care provider. Ask your health care  provider what activities are safe for you. General instructions Take over-the-counter and prescription medicines only as told by your health care provider. Do not use any products that contain nicotine or tobacco, such as cigarettes and e-cigarettes. If you need help quitting, ask your health care provider. You may shower after 24 hours, but Do not take baths, swim, or use a hot tub for 1 week.  Do not drink alcohol for  24 hours after your procedure. Keep all follow-up visits as told by your health care provider. This is important. Contact a health care provider if: You have redness, mild swelling, or pain around your puncture site. You have fluid or blood coming from your puncture site that stops after applying firm pressure to the area. Your puncture site feels warm to the touch. You have pus or a bad smell coming from your puncture site. You have a fever. You have chest pain or discomfort that spreads to your neck, jaw, or arm. You are sweating a lot. You feel nauseous. You have a fast or irregular heartbeat. You have shortness of breath. You are dizzy or light-headed and feel the need to lie down. You have pain or numbness in the arm or leg closest to your puncture site. Get help right away if: Your puncture site suddenly swells. Your puncture site is bleeding and the bleeding does not stop after applying firm pressure to the area. These symptoms may represent a serious problem that is an emergency. Do not wait to see if the symptoms will go away. Get medical help right away. Call your local emergency services (911 in the [place] Do not drive yourself to the hospital. Summary After the procedure, it is normal to have bruising and tenderness at the puncture site in your groin, neck, or forearm. Check your puncture site every day for signs of infection. Get help right away if your puncture site is bleeding and the bleeding does not stop after applying firm pressure to the area. This is a medical emergency. This information is not intended to replace advice given to you by your health care provider. Make sure you discuss any questions you have with your health care provider.

## 2022-05-27 ENCOUNTER — Encounter (HOSPITAL_COMMUNITY): Payer: Self-pay | Admitting: Cardiology

## 2022-05-27 LAB — POCT ACTIVATED CLOTTING TIME
Activated Clotting Time: 311 seconds
Activated Clotting Time: 293 seconds

## 2022-05-27 MED FILL — Heparin Sod (Porcine)-NaCl IV Soln 1000 Unit/500ML-0.9%: INTRAVENOUS | Qty: 500 | Status: AC

## 2022-05-27 MED FILL — Lidocaine HCl Local Preservative Free (PF) Inj 1%: INTRAMUSCULAR | Qty: 30 | Status: AC

## 2022-06-20 ENCOUNTER — Ambulatory Visit (HOSPITAL_COMMUNITY)
Admission: RE | Admit: 2022-06-20 | Discharge: 2022-06-20 | Disposition: A | Discharge status: Home / Self Care | Payer: 59 | Source: Ambulatory Visit | Attending: Physician Assistant | Admitting: Physician Assistant

## 2022-06-20 ENCOUNTER — Encounter (HOSPITAL_COMMUNITY): Payer: Self-pay | Admitting: Physician Assistant

## 2022-06-20 VITALS — BP 112/80 | HR 72 | Ht 71.0 in | Wt 218.4 lb

## 2022-06-20 DIAGNOSIS — I480 Paroxysmal atrial fibrillation: Secondary | ICD-10-CM | POA: Insufficient documentation

## 2022-06-20 DIAGNOSIS — Z7901 Long term (current) use of anticoagulants: Secondary | ICD-10-CM | POA: Insufficient documentation

## 2022-06-20 MED ORDER — METOPROLOL TARTRATE 25 MG PO TABS: 25.0000 mg | ORAL_TABLET | Freq: Three times a day (TID) | ORAL | 1 refills | Status: DC | PRN
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
## 2022-06-21 ENCOUNTER — Ambulatory Visit (HOSPITAL_COMMUNITY): Payer: 59 | Admitting: Physician Assistant

## 2022-07-05 ENCOUNTER — Other Ambulatory Visit (HOSPITAL_COMMUNITY): Payer: Self-pay | Admitting: Internal Medicine

## 2022-07-05 NOTE — Telephone Encounter (Signed)
This is a A-Fib clinic pt, just seen this month August 2023. Please address

## 2022-08-21 ENCOUNTER — Encounter: Payer: Self-pay | Admitting: Cardiology

## 2022-08-21 ENCOUNTER — Ambulatory Visit: Payer: 59 | Attending: Cardiology | Admitting: Cardiology

## 2022-08-21 VITALS — BP 116/74 | HR 58 | Ht 71.0 in | Wt 217.4 lb

## 2022-08-21 DIAGNOSIS — I480 Paroxysmal atrial fibrillation: Secondary | ICD-10-CM
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
## 2022-08-21 NOTE — Patient Instructions (Signed)
Medication Instructions:  Your physician recommends that you continue on your current medications as directed. Please refer to the Current Medication list given to you today.  *If you need a refill on your cardiac medications before your next appointment, please call your pharmacy*   Follow-Up: At [facility], you and your health needs are our priority.  As part of our continuing mission to provide you with exceptional heart care, we have created designated Provider Care Teams.  These Care Teams include your primary Cardiologist (physician) and Advanced Practice Providers (APPs -  Physician Assistants and Nurse Practitioners) who all work together to provide you with the care you need, when you need it.  Your next appointment:   3 month(s)  The format for your next appointment:   In Person  Provider:   Allegra Lai, MD   Important Information About Sugar

## 2022-08-22 ENCOUNTER — Telehealth: Payer: Self-pay | Admitting: Cardiology

## 2022-08-22 NOTE — Telephone Encounter (Signed)
Pt c/o medication issue:  1. Name of Medication:   rivaroxaban (XARELTO) 20 MG TABS tablet     2. How are you currently taking this medication (dosage and times per day)? Take 1 tablet (20 mg total) by mouth daily with supper. - Oral  3. Are you having a reaction (difficulty breathing--STAT)?   4. What is your medication issue? Pt calling stating she was never told whether she should stay on xarelto or not

## 2022-08-22 NOTE — Telephone Encounter (Signed)
Called patient back to let her know according to Dr. Macky Lower note from yesterday that she needs to continue her metoprolol and xarelto.

## 2022-08-29 ENCOUNTER — Other Ambulatory Visit (HOSPITAL_COMMUNITY): Payer: Self-pay | Admitting: Physician Assistant

## 2022-09-09 ENCOUNTER — Encounter: Payer: Self-pay | Admitting: Family Medicine

## 2022-09-09 ENCOUNTER — Ambulatory Visit (INDEPENDENT_AMBULATORY_CARE_PROVIDER_SITE_OTHER): Payer: 59 | Admitting: Family Medicine

## 2022-09-09 VITALS — BP 123/75 | HR 78 | Temp 98.2°F | Ht 71.0 in | Wt 207.8 lb

## 2022-09-09 DIAGNOSIS — K2900 Acute gastritis without bleeding: Secondary | ICD-10-CM | POA: Diagnosis not present

## 2022-09-09 LAB — POC COVID19 BINAXNOW: SARS Coronavirus 2 Ag: NEGATIVE

## 2022-09-09 MED ORDER — ONDANSETRON HCL 4 MG PO TABS: 4.0000 mg | ORAL_TABLET | Freq: Three times a day (TID) | ORAL | 0 refills | Status: DC | PRN

## 2022-09-09 NOTE — Progress Notes (Signed)
Established Patient Office Visit  Subjective   Patient ID: Lindsey Bishop, female    DOB: 1961-05-04  Age: 61 y.o. MRN: [id number]  Chief Complaint  Patient presents with   Acute Visit    Pt states she can't drink anything throwing up thinks she maybe dehydrated x 5 days    HPI presents with a 3 to 4-day history of malaise fatigue, nausea vomiting.  She has had no diarrhea or loose stool.  There is been no abdominal pain.  She denies chest pain or shortness of breath.  There is been no headache stuffy nose postnasal drip sore throat or cough.  Denies fever or chills.    Review of Systems  Constitutional: Negative.  Negative for chills, fever and malaise/fatigue.  HENT: Negative.  Negative for congestion and sore throat.   Eyes:  Negative for blurred vision, discharge and redness.  Respiratory: Negative.  Negative for cough and shortness of breath.   Cardiovascular: Negative.  Negative for chest pain.  Gastrointestinal:  Negative for abdominal pain.  Genitourinary: Negative.   Musculoskeletal: Negative.  Negative for falls and myalgias.  Skin:  Negative for rash.  Neurological:  Negative for tingling, loss of consciousness, weakness and headaches.  Endo/Heme/Allergies:  Negative for polydipsia.      Objective:     BP 123/75 (BP Location: Right Arm, Patient Position: Sitting, Cuff Size: Large)   Pulse 78   Temp 98.2 F (36.8 C) (Temporal)   Ht '5\' 11"'$  (1.803 m)   Wt 207 lb 12.8 oz (94.3 kg)   SpO2 100%   BMI 28.98 kg/m    Physical Exam Constitutional:      General: She is not in acute distress.    Appearance: Normal appearance. She is not ill-appearing, toxic-appearing or diaphoretic.  HENT:     Head: Normocephalic and atraumatic.     Right Ear: External ear normal.     Left Ear: External ear normal.     Mouth/Throat:     Mouth: Mucous membranes are moist.     Pharynx: Oropharynx is clear. No oropharyngeal exudate or posterior oropharyngeal erythema.  Eyes:      General: No scleral icterus.       Right eye: No discharge.        Left eye: No discharge.     Extraocular Movements: Extraocular movements intact.     Conjunctiva/sclera: Conjunctivae normal.     Pupils: Pupils are equal, round, and reactive to light.  Cardiovascular:     Rate and Rhythm: Normal rate and regular rhythm.  Pulmonary:     Effort: Pulmonary effort is normal. No respiratory distress.     Breath sounds: Normal breath sounds.  Abdominal:     General: Bowel sounds are normal.  Musculoskeletal:     Cervical back: No rigidity or tenderness.  Skin:    General: Skin is warm and dry.  Neurological:     Mental Status: She is alert and oriented to person, place, and time.  Psychiatric:        Mood and Affect: Mood normal.        Behavior: Behavior normal.      No results found for any visits on 09/09/22.    The 10-year ASCVD risk score (Arnett DK, et al., 2019) is: 2.4%    Assessment & Plan:   Problem List Items Addressed This Visit   None Visit Diagnoses     Acute gastritis without hemorrhage, unspecified gastritis type    -  Primary   Relevant Medications   ondansetron (ZOFRAN) 4 MG tablet   Other Relevant Orders   POC COVID-19       Return Return in 2 to 3 days if not improving..  We will concentrate on fluid intake.  Ginger ale is a great choice.  Gatorade can be a choice.  Avoid caffeinated beverages.  As appetite returns consider the brat diet.  Follow-up in 2 to 3 days improving.  Use Zofran as needed.  Libby Maw, MD

## 2022-09-24 ENCOUNTER — Other Ambulatory Visit: Payer: Self-pay | Admitting: Radiology

## 2022-09-24 DIAGNOSIS — Z1231 Encounter for screening mammogram for malignant neoplasm of breast: Secondary | ICD-10-CM

## 2022-10-04 ENCOUNTER — Other Ambulatory Visit (HOSPITAL_BASED_OUTPATIENT_CLINIC_OR_DEPARTMENT_OTHER): Payer: Self-pay

## 2022-10-04 ENCOUNTER — Ambulatory Visit (INDEPENDENT_AMBULATORY_CARE_PROVIDER_SITE_OTHER): Payer: 59 | Admitting: Radiology

## 2022-10-04 ENCOUNTER — Encounter (HOSPITAL_BASED_OUTPATIENT_CLINIC_OR_DEPARTMENT_OTHER): Payer: Self-pay | Admitting: Pharmacist

## 2022-10-04 ENCOUNTER — Other Ambulatory Visit (HOSPITAL_COMMUNITY)
Admission: RE | Admit: 2022-10-04 | Discharge: 2022-10-04 | Disposition: A | Discharge status: Home / Self Care | Payer: 59 | Source: Ambulatory Visit | Attending: Radiology | Admitting: Radiology

## 2022-10-04 ENCOUNTER — Encounter: Payer: Self-pay | Admitting: Radiology

## 2022-10-04 VITALS — BP 134/88 | Ht 70.0 in | Wt 208.0 lb

## 2022-10-04 DIAGNOSIS — Z01419 Encounter for gynecological examination (general) (routine) without abnormal findings: Secondary | ICD-10-CM | POA: Insufficient documentation

## 2022-10-04 DIAGNOSIS — Z780 Asymptomatic menopausal state: Secondary | ICD-10-CM | POA: Diagnosis not present

## 2022-10-04 DIAGNOSIS — Z23 Encounter for immunization: Secondary | ICD-10-CM

## 2022-10-04 DIAGNOSIS — K641 Second degree hemorrhoids: Secondary | ICD-10-CM

## 2022-10-04 MED ORDER — WEGOVY 0.25 MG/0.5ML ~~LOC~~ SOAJ
0.2500 mg | SUBCUTANEOUS | 0 refills | Status: DC
  Filled 2022-10-04: qty 2, 28d supply, fill #0

## 2022-10-04 MED ORDER — HYDROCORTISONE ACETATE 25 MG RE SUPP: 25.0000 mg | Freq: Two times a day (BID) | RECTAL | 0 refills | Status: DC

## 2022-10-04 NOTE — Progress Notes (Signed)
Lindsey Bishop [birth year] [id number]   History:  61 y.o. G4P4 presents for annual exam, no gyn concerns, working to lose weight, has lost some on her own, interested in wegovy. C/o thrombosed hemorrhoid after having a diarrhea illness.   Gynecologic History No LMP recorded. Patient is postmenopausal.   Contraception/Family planning: post menopausal status Sexually active: yes Last Pap: 2020. Results were: normal Last mammogram: 2022. Results were: normal  Obstetric History OB History  Gravida Para Term Preterm AB Living  '4 4 4     4  '$ SAB IAB Ectopic Multiple Live Births               # Outcome Date GA Lbr Len/2nd Weight Sex Delivery Anes PTL Lv  4 Term           3 Term           2 Term           1 Term              The following portions of the patient's history were reviewed and updated as appropriate: allergies, current medications, past family history, past medical history, past social history, past surgical history, and problem list.  Review of Systems Pertinent items noted in HPI and remainder of comprehensive ROS otherwise negative.   Past medical history, past surgical history, family history and social history were all reviewed and documented in the EPIC chart.   Exam:  Vitals:   10/04/22 1545  BP: 134/88  Weight: 208 lb (94.3 kg)  Height: '5\' 10"'$  (1.778 m)   Body mass index is 29.84 kg/m.  General appearance:  Normal Thyroid:  Symmetrical, normal in size, without palpable masses or nodularity. Respiratory  Auscultation:  Clear without wheezing or rhonchi Cardiovascular  Auscultation:  Regular rate, without rubs, murmurs or gallops  Edema/varicosities:  Not grossly evident Abdominal  Soft,nontender, without masses, guarding or rebound.  Liver/spleen:  No organomegaly noted  Hernia:  None appreciated  Skin  Inspection:  Grossly normal Breasts: Examined lying and sitting.   Right: Without masses, retractions, nipple discharge or axillary  adenopathy.   Left: Without masses, retractions, nipple discharge or axillary adenopathy. Genitourinary   Inguinal/mons:  Normal without inguinal adenopathy  External genitalia:  Normal appearing vulva with no masses, tenderness, or lesions  BUS/Urethra/Skene's glands:  Normal without masses or exudate  Vagina:  Normal appearing with normal color and discharge, no lesions  Cervix:  Normal appearing without discharge or lesions  Uterus:  Normal in size, shape and contour.  Mobile, nontender  Adnexa/parametria:     Rt: Normal in size, without masses or tenderness.   Lt: Normal in size, without masses or tenderness.  Anus and perineum: Normal   Patient informed chaperone available to be present for breast and pelvic exam. Patient has requested no chaperone to be present. Patient has been advised what will be completed during breast and pelvic exam.   Assessment/Plan:   1. Well woman exam with routine gynecological exam - Cytology - PAP( [facility])  2. Post-menopausal - DG Bone Density; Future  3. Need for immunization against influenza - Flu Vaccine QUAD 36moIM (Fluarix, Fluzone & Alfiuria Quad PF)  4. Morbid obesity ([place] - Semaglutide-Weight Management (WEGOVY) 0.25 MG/0.5ML SOAJ; Inject 0.25 mg into the skin once a week.  Dispense: 2 mL; Refill: 0  Follow up 3 months  5. Grade II hemorrhoids  - hydrocortisone (ANUSOL-HC) 25 MG suppository; Place 1 suppository (25 mg total) rectally  2 (two) times daily.  Dispense: 12 suppository; Refill: 0   Discussed SBE, colonoscopy and DEXA screening as directed/appropriate. Recommend 112mns of exercise weekly, including weight bearing exercise. Encouraged the use of seatbelts and sunscreen. Return in 1 year for annual or as needed.   CRubbie BattiestB WHNP-BC 4:17 PM 10/04/2022

## 2022-10-07 ENCOUNTER — Telehealth: Payer: Self-pay

## 2022-10-07 ENCOUNTER — Other Ambulatory Visit (HOSPITAL_BASED_OUTPATIENT_CLINIC_OR_DEPARTMENT_OTHER): Payer: Self-pay

## 2022-10-07 NOTE — Telephone Encounter (Signed)
PA initiated through covermymeds for Wegovy  Key:  [id number] 

## 2022-10-08 LAB — CYTOLOGY - PAP
Comment: NEGATIVE
Diagnosis: NEGATIVE
High risk HPV: NEGATIVE

## 2022-10-11 ENCOUNTER — Other Ambulatory Visit: Payer: Self-pay | Admitting: Family Medicine

## 2022-10-11 DIAGNOSIS — K2900 Acute gastritis without bleeding: Secondary | ICD-10-CM

## 2022-10-14 ENCOUNTER — Telehealth: Payer: Self-pay | Admitting: Internal Medicine

## 2022-10-14 NOTE — Telephone Encounter (Signed)
Called the patient to discuss. No answer. Left her a message in the voicemail.

## 2022-10-14 NOTE — Telephone Encounter (Signed)
Patient called states she has a hemorrhoid and is seeking advise.

## 2022-10-15 NOTE — Telephone Encounter (Signed)
Patient returning your call. States she has a hemorrhoid and didn't know if she could be seen with her pcp or Dr.Dorsey. Soonest available appt with Dr.Dorsey or APP is January. Patient states she can't wait that long. Says if she is unable to be reached you can leave her a detailed message.

## 2022-10-15 NOTE — Telephone Encounter (Signed)
With the assistance of Ronalee Belts, the patient is scheduled for evaluation on 10/23/22.

## 2022-10-23 ENCOUNTER — Ambulatory Visit (INDEPENDENT_AMBULATORY_CARE_PROVIDER_SITE_OTHER): Payer: 59 | Admitting: Internal Medicine

## 2022-10-23 ENCOUNTER — Encounter: Payer: Self-pay | Admitting: Internal Medicine

## 2022-10-23 VITALS — BP 120/72 | HR 60 | Ht 70.0 in | Wt 205.0 lb

## 2022-10-23 DIAGNOSIS — K649 Unspecified hemorrhoids: Secondary | ICD-10-CM | POA: Diagnosis not present

## 2022-10-23 MED ORDER — HYDROCORTISONE (PERIANAL) 2.5 % EX CREA: 1.0000 | TOPICAL_CREAM | Freq: Two times a day (BID) | CUTANEOUS | 1 refills | Status: DC

## 2022-10-23 NOTE — Progress Notes (Signed)
   Chief Complaint: Hemorrhoids  HPI : 61 year old female with history of obesity, A-fib, SVT presents to discuss hemorrhoids  Interval History: Last month she developed an episode of diarrhea that cause a hemorrhoid to pop out. Denies rectal bleeding or pain. She has itching, which has improved after Anusol suppositories. She has not tried any other therapies for the hemorrhoids. On average she has one BM once every 3 days.   Current Outpatient Medications  Medication Sig Dispense Refill   metoprolol tartrate (LOPRESSOR) 25 MG tablet Take 1 tablet (25 mg total) by mouth 2 (two) times daily as needed (for afib). 30 tablet 1   hydrocortisone (ANUSOL-HC) 25 MG suppository Place 1 suppository (25 mg total) rectally 2 (two) times daily. (Patient not taking: Reported on 10/23/2022) 12 suppository 0   ondansetron (ZOFRAN) 4 MG tablet TAKE 1 TABLET BY MOUTH EVERY 8 HOURS AS NEEDED FOR NAUSEA FOR VOMITING (Patient not taking: Reported on 10/23/2022) 20 tablet 0   Semaglutide-Weight Management (WEGOVY) 0.25 MG/0.5ML SOAJ Inject 0.25 mg into the skin once a week. (Patient not taking: Reported on 10/23/2022) 2 mL 0   No current facility-administered medications for this visit.   Allergies  Allergen Reactions   Multaq [Dronedarone] Other (See Comments)     drug induced hepatitis   Latex Rash   Meperidine Nausea And Vomiting   Tylenol [Acetaminophen] Other (See Comments)    Pt doesn't like Tylenol, due to past experiences    Review of Systems: All systems reviewed and negative except where noted in HPI.   Physical Exam: BP 120/72   Pulse 60   Ht '5\' 10"'$  (1.778 m)   Wt 205 lb (93 kg)   SpO2 99%   BMI 29.41 kg/m  Constitutional: Pleasant,well-developed, female in no acute distress. HEENT: Normocephalic and atraumatic. Conjunctivae are normal. No scleral icterus. Cardiovascular: Normal rate Pulmonary/chest: Effort normal Rectal: One external hemorrhoid that is not tender Neurological: Alert and  oriented to person place and time. Skin: Skin is warm and dry. No rashes noted. Psychiatric: Normal mood and affect. Behavior is normal.  Labs 02/2022: BMP unremarkable  Labs 04/2022: CBC and BMP unremarkable  Colonoscopy - Two 3 to 4 mm polyps in the transverse colon, removed with a cold snare. Resected and retrieved. - Two 3 to 4 mm polyps in the sigmoid colon, removed with a cold snare. Resected and retrieved. - Non-bleeding internal hemorrhoids. Path: Surgical [P], colon, sigmoid and transverse, polyp (4) - TUBULAR ADENOMA WITHOUT HIGH-GRADE DYSPLASIA OR MALIGNANCY - SESSILE SERRATED POLYP WITHOUT CYTOLOGIC DYSPLASIA - HYPERPLASTIC POLYP(S)  ASSESSMENT AND PLAN: Hemorrhoids Patient had an external hemorrhoid develop after a bout of diarrhea. I encouraged her to try Anusol HC cream over that area and to follow other conservative measures for now. Hemorrhoidal banding could be tried but wouldn't be guaranteed to be effective for an external hemorrhoid. - Encourage fiber intake - Avoid straining or sitting on the toilet for long periods of time - Sitz baths - Anusol HC cream BID for 7 days  Christia Reading, MD  I spent 30 minutes of time, including in depth chart review, independent review of results as outlined above, communicating results with the patient directly, face-to-face time with the patient, coordinating care, and ordering studies and medications as appropriate, and documentation.

## 2022-10-23 NOTE — Patient Instructions (Addendum)
_______________________________________________________  If you are age 60 or older, your body mass index should be between 23-30. Your Body mass index is 29.41 kg/m. If this is out of the aforementioned range listed, please consider follow up with your Primary Care Provider.  If you are age 31 or younger, your body mass index should be between 19-25. Your Body mass index is 29.41 kg/m. If this is out of the aformentioned range listed, please consider follow up with your Primary Care Provider.   ________________________________________________________  The  GI providers would like to encourage you to use [facility] to communicate with providers for non-urgent requests or questions.  Due to long hold times on the telephone, sending your provider a message by [facility] may be a faster and more efficient way to get a response.  Please allow 48 business hours for a response.  Please remember that this is for non-urgent requests.  _______________________________________________________  We have sent the following medications to your pharmacy for you to pick up at your convenience:Anusol cream 2 times a day for 7 days  No staining.  Please try to increase fiber intake  Call or message in 1-2 months with an update to the nurse.  Sitz bath 2 times a day for 2 weeks  How to Take a [employer] A sitz bath is a warm water bath that may be used to care for your rectum, genital area, or the area between your rectum and genitals (perineum). In a sitz bath, the water only comes up to your hips and covers your buttocks. A sitz bath may be done in a bathtub or with a portable sitz bath that fits over the toilet. Your health care provider may recommend a sitz bath to help: Relieve pain and discomfort after delivering a baby. Relieve pain and itching from hemorrhoids or anal fissures. Relieve pain after certain surgeries. Relax muscles that are sore or tight. How to take a sitz bath Take 2-4 sitz baths a  day, or as many as told by your health care provider. Bathtub sitz bath To take a sitz bath in a bathtub: Partially fill a bathtub with warm water. The water should be deep enough to cover your hips and buttocks when you are sitting in the bathtub. Follow your health care provider's instructions if you are told to put medicine in the water. Sit in the water. Open the bathtub drain a little, and leave it open during your bath. Turn on the warm water again, enough to replace the water that is draining out. Keep the water running throughout your bath. This helps keep the water at the right level and temperature. Soak in the water for 15-20 minutes, or as long as told by your health care provider. When you are done, be careful when you stand up. You may feel dizzy. After the sitz bath, pat yourself dry. Do not rub your skin to dry it.  Over-the-toilet sitz bath To take a sitz bath with an over-the-toilet basin: Follow the manufacturer's instructions. Fill the basin with warm water. Follow your health care provider's instructions if you were told to put medicine in the water. Sit on the seat. Make sure the water covers your buttocks and perineum. Soak in the water for 15-20 minutes, or as long as told by your health care provider. After the sitz bath, pat yourself dry. Do not rub your skin to dry it. Clean and dry the basin between uses. Discard the basin if it cracks, or according to the manufacturer's  instructions.  Contact a health care provider if: Your pain or itching gets worse. Stop doing sitz baths if your symptoms get worse. You have new symptoms. Stop doing sitz baths until you talk with your health care provider. Summary A sitz bath is a warm water bath in which the water only comes up to your hips and covers your buttocks. Your health care provider may recommend a sitz bath to help relieve pain and discomfort after delivering a baby, relieve pain and itching from hemorrhoids or anal  fissures, relieve pain after certain surgeries, or help to relax muscles that are sore or tight. Take 2-4 sitz baths a day, or as many as told by your health care provider. Soak in the water for 15-20 minutes. Stop doing sitz baths if your symptoms get worse. This information is not intended to replace advice given to you by your health care provider. Make sure you discuss any questions you have with your health care provider. Document Revised: 02/05/2022 Document Reviewed: 02/05/2022 Elsevier Patient Education  [place] you for entrusting me with your care and for choosing [employer], Dr. Christia Reading

## 2022-10-30 ENCOUNTER — Other Ambulatory Visit (HOSPITAL_BASED_OUTPATIENT_CLINIC_OR_DEPARTMENT_OTHER): Payer: Self-pay

## 2022-11-19 ENCOUNTER — Other Ambulatory Visit: Payer: Self-pay

## 2022-11-21 ENCOUNTER — Ambulatory Visit: Payer: 59 | Attending: Cardiology | Admitting: Cardiology

## 2022-11-21 ENCOUNTER — Ambulatory Visit (INDEPENDENT_AMBULATORY_CARE_PROVIDER_SITE_OTHER): Payer: 59

## 2022-11-21 ENCOUNTER — Encounter: Payer: Self-pay | Admitting: Cardiology

## 2022-11-21 VITALS — BP 110/76 | HR 68 | Ht 70.0 in | Wt 210.0 lb

## 2022-11-21 DIAGNOSIS — I471 Supraventricular tachycardia, unspecified: Secondary | ICD-10-CM | POA: Diagnosis not present

## 2022-11-21 DIAGNOSIS — R002 Palpitations: Secondary | ICD-10-CM | POA: Diagnosis not present

## 2022-11-21 DIAGNOSIS — I480 Paroxysmal atrial fibrillation: Secondary | ICD-10-CM

## 2022-11-21 MED ORDER — METOPROLOL TARTRATE 25 MG PO TABS: 25.0000 mg | ORAL_TABLET | Freq: Two times a day (BID) | ORAL | 1 refills | Status: DC | PRN

## 2022-11-21 NOTE — Patient Instructions (Signed)
Medication Instructions:  Your physician recommends that you continue on your current medications as directed. Please refer to the Current Medication list given to you today.  *If you need a refill on your cardiac medications before your next appointment, please call your pharmacy*   Lab Work: None ordered.  If you have labs (blood work) drawn today and your tests are completely normal, you will receive your results only by: MyChart Message (if you have MyChart) OR A paper copy in the mail If you have any lab test that is abnormal or we need to change your treatment, we will call you to review the results.   Testing/Procedures: ZIO XT- Long Term Monitor Instructions  Your physician has requested you wear a ZIO patch monitor for 14 days.  This is a single patch monitor. [employer] supplies one patch monitor per enrollment. Additional stickers are not available. Please do not apply patch if you will be having a Nuclear Stress Test,  Echocardiogram, Cardiac CT, MRI, or Chest Xray during the period you would be wearing the  monitor. The patch cannot be worn during these tests. You cannot remove and re-apply the  ZIO XT patch monitor.  Your ZIO patch monitor will be mailed 3 day USPS to your address on file. It may take 3-5 days  to receive your monitor after you have been enrolled.  Once you have received your monitor, please review the enclosed instructions. Your monitor  has already been registered assigning a specific monitor serial # to you.  Billing and Patient Assistance Program Information  We have supplied [employer] with any of your insurance information on file for billing purposes. [employer] offers a sliding scale Patient Assistance Program for patients that do not have  insurance, or whose insurance does not completely cover the cost of the ZIO monitor.  You must apply for the Patient Assistance Program to qualify for this discounted rate.  To apply, please call [employer] at  [phone], select option 4, select option 2, ask to apply for  Patient Assistance Program. [employer] will ask your household income, and how many people  are in your household. They will quote your out-of-pocket cost based on that information.  [employer] will also be able to set up a 12-month, interest-free payment plan if needed.  Applying the monitor   Shave hair from upper left chest.  Hold abrader disc by orange tab. Rub abrader in 40 strokes over the upper left chest as  indicated in your monitor instructions.  Clean area with 4 enclosed alcohol pads. Let dry.  Apply patch as indicated in monitor instructions. Patch will be placed under collarbone on left  side of chest with arrow pointing upward.  Rub patch adhesive wings for 2 minutes. Remove white label marked "1". Remove the white  label marked "2". Rub patch adhesive wings for 2 additional minutes.  While looking in a mirror, press and release button in center of patch. A small green light will  flash 3-4 times. This will be your only indicator that the monitor has been turned on.  Do not shower for the first 24 hours. You may shower after the first 24 hours.  Press the button if you feel a symptom. You will hear a small click. Record Date, Time and  Symptom in the Patient Logbook.  When you are ready to remove the patch, follow instructions on the last 2 pages of Patient  Logbook. Stick patch monitor onto the last page of Patient Logbook.  Place Patient   Logbook in the blue and white box. Use locking tab on box and tape box closed  securely. The blue and white box has prepaid postage on it. Please place it in the mailbox as  soon as possible. Your physician should have your test results approximately 7 days after the  monitor has been mailed back to [facility].  Call [facility] at [phone] if you have questions regarding  your ZIO XT patch monitor. Call them immediately if you see an orange light  blinking on your  monitor.  If your monitor falls off in less than 4 days, contact our Monitor department at [phone].  If your monitor becomes loose or falls off after 4 days call [employer] at [phone] for  suggestions on securing your monitor    Follow-Up: At [facility], you and your health needs are our priority.  As part of our continuing mission to provide you with exceptional heart care, we have created designated Provider Care Teams.  These Care Teams include your primary Cardiologist (physician) and Advanced Practice Providers (APPs -  Physician Assistants and Nurse Practitioners) who all work together to provide you with the care you need, when you need it.  We recommend signing up for the patient portal called "MyChart".  Sign up information is provided on this After Visit Summary.  MyChart is used to connect with patients for Virtual Visits (Telemedicine).  Patients are able to view lab/test results, encounter notes, upcoming appointments, etc.  Non-urgent messages can be sent to your provider as well.   To learn more about what you can do with MyChart, go to [email or web address].    Your next appointment:   6 months with Dr Curt Bears  Important Information About Sugar

## 2022-11-21 NOTE — Progress Notes (Unsigned)
Enrolled for [employer] to mail a ZIO XT long term holter monitor to the patients address on file.  
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
## 2022-11-25 ENCOUNTER — Ambulatory Visit
Admission: RE | Admit: 2022-11-25 | Discharge: 2022-11-25 | Disposition: A | Discharge status: Home / Self Care | Payer: 59 | Source: Ambulatory Visit | Attending: Radiology | Admitting: Radiology

## 2022-11-25 DIAGNOSIS — Z1231 Encounter for screening mammogram for malignant neoplasm of breast: Secondary | ICD-10-CM

## 2022-11-29 ENCOUNTER — Other Ambulatory Visit (HOSPITAL_BASED_OUTPATIENT_CLINIC_OR_DEPARTMENT_OTHER): Payer: Self-pay

## 2022-11-30 ENCOUNTER — Other Ambulatory Visit (HOSPITAL_BASED_OUTPATIENT_CLINIC_OR_DEPARTMENT_OTHER): Payer: Self-pay

## 2022-12-04 ENCOUNTER — Other Ambulatory Visit: Payer: Self-pay

## 2022-12-05 ENCOUNTER — Ambulatory Visit (INDEPENDENT_AMBULATORY_CARE_PROVIDER_SITE_OTHER): Payer: 59 | Admitting: Nurse Practitioner

## 2022-12-05 ENCOUNTER — Encounter: Payer: Self-pay | Admitting: Nurse Practitioner

## 2022-12-05 VITALS — BP 128/88 | HR 60 | Temp 96.5°F | Ht 70.0 in | Wt 210.4 lb

## 2022-12-05 DIAGNOSIS — Z0000 Encounter for general adult medical examination without abnormal findings: Secondary | ICD-10-CM | POA: Diagnosis not present

## 2022-12-05 DIAGNOSIS — E6609 Other obesity due to excess calories: Secondary | ICD-10-CM

## 2022-12-05 DIAGNOSIS — Z13220 Encounter for screening for lipoid disorders: Secondary | ICD-10-CM | POA: Diagnosis not present

## 2022-12-05 DIAGNOSIS — Z6830 Body mass index (BMI) 30.0-30.9, adult: Secondary | ICD-10-CM

## 2022-12-05 DIAGNOSIS — Z136 Encounter for screening for cardiovascular disorders: Secondary | ICD-10-CM

## 2022-12-05 NOTE — Assessment & Plan Note (Signed)
Wegovy prescribed by GYN Wt Readings from Last 3 Encounters:  12/05/22 210 lb 6.4 oz (95.4 kg)  11/21/22 210 lb (95.3 kg)  10/23/22 205 lb (93 kg)    Advised to incorporate heart healthy diet and daily exercise

## 2022-12-05 NOTE — Patient Instructions (Signed)
Go to lab  Preventive Care 40-61 Years Old, Female Preventive care refers to lifestyle choices and visits with your health care provider that can promote health and wellness. Preventive care visits are also called wellness exams. What can I expect for my preventive care visit? Counseling Your health care provider may ask you questions about your: Medical history, including: Past medical problems. Family medical history. Pregnancy history. Current health, including: Menstrual cycle. Method of birth control. Emotional well-being. Home life and relationship well-being. Sexual activity and sexual health. Lifestyle, including: Alcohol, nicotine or tobacco, and drug use. Access to firearms. Diet, exercise, and sleep habits. Work and work environment. Sunscreen use. Safety issues such as seatbelt and bike helmet use. Physical exam Your health care provider will check your: Height and weight. These may be used to calculate your BMI (body mass index). BMI is a measurement that tells if you are at a healthy weight. Waist circumference. This measures the distance around your waistline. This measurement also tells if you are at a healthy weight and may help predict your risk of certain diseases, such as type 2 diabetes and high blood pressure. Heart rate and blood pressure. Body temperature. Skin for abnormal spots. What immunizations do I need?  Vaccines are usually given at various ages, according to a schedule. Your health care provider will recommend vaccines for you based on your age, medical history, and lifestyle or other factors, such as travel or where you work. What tests do I need? Screening Your health care provider may recommend screening tests for certain conditions. This may include: Lipid and cholesterol levels. Diabetes screening. This is done by checking your blood sugar (glucose) after you have not eaten for a while (fasting). Pelvic exam and Pap test. Hepatitis B  test. Hepatitis C test. HIV (human immunodeficiency virus) test. STI (sexually transmitted infection) testing, if you are at risk. Lung cancer screening. Colorectal cancer screening. Mammogram. Talk with your health care provider about when you should start having regular mammograms. This may depend on whether you have a family history of breast cancer. BRCA-related cancer screening. This may be done if you have a family history of breast, ovarian, tubal, or peritoneal cancers. Bone density scan. This is done to screen for osteoporosis. Talk with your health care provider about your test results, treatment options, and if necessary, the need for more tests. Follow these instructions at home: Eating and drinking  Eat a diet that includes fresh fruits and vegetables, whole grains, lean protein, and low-fat dairy products. Take vitamin and mineral supplements as recommended by your health care provider. Do not drink alcohol if: Your health care provider tells you not to drink. You are pregnant, may be pregnant, or are planning to become pregnant. If you drink alcohol: Limit how much you have to 0-1 drink a day. Know how much alcohol is in your drink. In the [place] one drink equals one 12 oz bottle of beer (355 mL), one 5 oz glass of wine (148 mL), or one 1 oz glass of hard liquor (44 mL). Lifestyle Brush your teeth every morning and night with fluoride toothpaste. Floss one time each day. Exercise for at least 30 minutes 5 or more days each week. Do not use any products that contain nicotine or tobacco. These products include cigarettes, chewing tobacco, and vaping devices, such as e-cigarettes. If you need help quitting, ask your health care provider. Do not use drugs. If you are sexually active, practice safe sex. Use a condom or other   form of protection to prevent STIs. If you do not wish to become pregnant, use a form of birth control. If you plan to become pregnant, see your health care  provider for a prepregnancy visit. Take aspirin only as told by your health care provider. Make sure that you understand how much to take and what form to take. Work with your health care provider to find out whether it is safe and beneficial for you to take aspirin daily. Find healthy ways to manage stress, such as: Meditation, yoga, or listening to music. Journaling. Talking to a trusted person. Spending time with friends and family. Minimize exposure to UV radiation to reduce your risk of skin cancer. Safety Always wear your seat belt while driving or riding in a vehicle. Do not drive: If you have been drinking alcohol. Do not ride with someone who has been drinking. When you are tired or distracted. While texting. If you have been using any mind-altering substances or drugs. Wear a helmet and other protective equipment during sports activities. If you have firearms in your house, make sure you follow all gun safety procedures. Seek help if you have been physically or sexually abused. What's next? Visit your health care provider once a year for an annual wellness visit. Ask your health care provider how often you should have your eyes and teeth checked. Stay up to date on all vaccines. This information is not intended to replace advice given to you by your health care provider. Make sure you discuss any questions you have with your health care provider. Document Revised: 05/02/2021 Document Reviewed: 05/02/2021 Elsevier Patient Education  [place].
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
## 2022-12-06 ENCOUNTER — Other Ambulatory Visit (HOSPITAL_BASED_OUTPATIENT_CLINIC_OR_DEPARTMENT_OTHER): Payer: Self-pay

## 2022-12-06 LAB — COMPREHENSIVE METABOLIC PANEL
ALT: 13 U/L (ref 0–35)
AST: 15 U/L (ref 0–37)
Albumin: 4.2 g/dL (ref 3.5–5.2)
Alkaline Phosphatase: 84 U/L (ref 39–117)
BUN: 13 mg/dL (ref 6–23)
CO2: 28 mEq/L (ref 19–32)
Calcium: 10.2 mg/dL (ref 8.4–10.5)
Chloride: 104 mEq/L (ref 96–112)
Creatinine, Ser: 0.84 mg/dL (ref 0.40–1.20)
GFR: 75.46 mL/min (ref >60.00)
Glucose, Bld: 78 mg/dL (ref 70–99)
Potassium: 4.2 mEq/L (ref 3.5–5.1)
Sodium: 140 mEq/L (ref 135–145)
Total Bilirubin: 0.5 mg/dL (ref 0.2–1.2)
Total Protein: 7 g/dL (ref 6.0–8.3)

## 2022-12-06 LAB — LIPID PANEL
Cholesterol: 169 mg/dL (ref 0–200)
HDL: 70.4 mg/dL (ref >39.00)
LDL Cholesterol: 82 mg/dL (ref 0–99)
NonHDL: 98.76
Total CHOL/HDL Ratio: 2
Triglycerides: 82 mg/dL (ref 0.0–149.0)
VLDL: 16.4 mg/dL (ref 0.0–40.0)

## 2022-12-06 MED ORDER — WEGOVY 0.5 MG/0.5ML ~~LOC~~ SOAJ
0.5000 mg | SUBCUTANEOUS | 1 refills | Status: DC
  Filled 2022-12-06: qty 2, 28d supply, fill #0
  Filled 2023-01-15: qty 2, 28d supply, fill #1

## 2022-12-06 NOTE — Telephone Encounter (Signed)
Ok to send for the 0.'5mg'$  dose to take weekly with 1 refill

## 2022-12-08 DIAGNOSIS — R002 Palpitations: Secondary | ICD-10-CM

## 2022-12-09 ENCOUNTER — Other Ambulatory Visit (HOSPITAL_BASED_OUTPATIENT_CLINIC_OR_DEPARTMENT_OTHER): Payer: Self-pay

## 2022-12-16 ENCOUNTER — Other Ambulatory Visit (HOSPITAL_BASED_OUTPATIENT_CLINIC_OR_DEPARTMENT_OTHER): Payer: Self-pay

## 2022-12-18 ENCOUNTER — Other Ambulatory Visit (HOSPITAL_BASED_OUTPATIENT_CLINIC_OR_DEPARTMENT_OTHER): Payer: Self-pay

## 2023-01-08 ENCOUNTER — Telehealth: Payer: Self-pay | Admitting: Cardiology
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
## 2023-01-08 NOTE — Telephone Encounter (Signed)
Patient is returning call to discuss monitor results. 

## 2023-01-09 MED ORDER — METOPROLOL TARTRATE 25 MG PO TABS: 25.0000 mg | ORAL_TABLET | Freq: Two times a day (BID) | ORAL | 11 refills | Status: DC | PRN

## 2023-01-09 NOTE — Telephone Encounter (Signed)
Advised can monitor for now and readdress if/when it is needed. Pt can feel her afib so she is agreeable to discussing again if needed. For now she will continue monitoring. She appreciates my call.

## 2023-02-07 ENCOUNTER — Ambulatory Visit: Payer: 59 | Admitting: Nurse Practitioner

## 2023-02-07 ENCOUNTER — Encounter: Payer: Self-pay | Admitting: Nurse Practitioner

## 2023-02-07 VITALS — BP 110/70 | HR 70 | Temp 97.9°F | Resp 16 | Ht 70.0 in | Wt 202.0 lb

## 2023-02-07 DIAGNOSIS — T50995A Adverse effect of other drugs, medicaments and biological substances, initial encounter: Secondary | ICD-10-CM | POA: Diagnosis not present

## 2023-02-07 DIAGNOSIS — Z6830 Body mass index (BMI) 30.0-30.9, adult: Secondary | ICD-10-CM

## 2023-02-07 DIAGNOSIS — E6609 Other obesity due to excess calories: Secondary | ICD-10-CM

## 2023-02-07 DIAGNOSIS — T50905A Adverse effect of unspecified drugs, medicaments and biological substances, initial encounter: Secondary | ICD-10-CM

## 2023-02-07 DIAGNOSIS — R112 Nausea with vomiting, unspecified: Secondary | ICD-10-CM | POA: Diagnosis not present

## 2023-02-07 LAB — COMPREHENSIVE METABOLIC PANEL
ALT: 14 U/L (ref 0–35)
AST: 17 U/L (ref 0–37)
Albumin: 4.1 g/dL (ref 3.5–5.2)
Alkaline Phosphatase: 87 U/L (ref 39–117)
BUN: 10 mg/dL (ref 6–23)
CO2: 26 mEq/L (ref 19–32)
Calcium: 9.8 mg/dL (ref 8.4–10.5)
Chloride: 110 mEq/L (ref 96–112)
Creatinine, Ser: 0.94 mg/dL (ref 0.40–1.20)
GFR: 65.86 mL/min (ref >60.00)
Glucose, Bld: 82 mg/dL (ref 70–99)
Potassium: 4.3 mEq/L (ref 3.5–5.1)
Sodium: 145 mEq/L (ref 135–145)
Total Bilirubin: 0.3 mg/dL (ref 0.2–1.2)
Total Protein: 6.9 g/dL (ref 6.0–8.3)

## 2023-02-07 LAB — LIPASE: Lipase: 29 U/L (ref 11.0–59.0)

## 2023-02-07 MED ORDER — OMEPRAZOLE 40 MG PO CPDR: 40.0000 mg | DELAYED_RELEASE_CAPSULE | Freq: Every morning | ORAL | 0 refills | Status: DC

## 2023-02-07 MED ORDER — ONDANSETRON HCL 4 MG PO TABS: 4.0000 mg | ORAL_TABLET | Freq: Three times a day (TID) | ORAL | 0 refills | Status: DC | PRN

## 2023-02-07 NOTE — Progress Notes (Signed)
Established Patient Visit  Patient: Lindsey Bishop   DOB: 03/13/1961   62 y.o. Female  MRN: [id number] Visit Date: 02/07/2023  Subjective:    Chief Complaint  Patient presents with   GI Problem    N/V/D and "nasty burps"    HPI Obesity Wegovy dose was increased to 0.5mg  71month ago. Lost 8lbs with medication She developed N/V/D with dose increase. She stopped med 2weeks ago, but symptoms are persistent. Wt Readings from Last 3 Encounters:  02/07/23 202 lb (91.6 kg)  12/05/22 210 lb 6.4 oz (95.4 kg)  11/21/22 210 lb (95.3 kg)    We discussed side effects of Glp-1 injections, need to maintain low fat/low carb diet with medication and daily exercise to promote weight loss. Will check CMP and lipase today, continue to hold wegovy, sent zofran and omeprazole to manage symptoms. F/up as needed   Reviewed medical, surgical, and social history today  Medications: Outpatient Medications Prior to Visit  Medication Sig   metoprolol tartrate (LOPRESSOR) 25 MG tablet Take 1 tablet (25 mg total) by mouth 2 (two) times daily as needed (for afib).   [DISCONTINUED] Semaglutide-Weight Management (WEGOVY) 0.5 MG/0.5ML SOAJ Inject 0.5 mg into the skin once a week.   [DISCONTINUED] Semaglutide-Weight Management (WEGOVY) 0.25 MG/0.5ML SOAJ Inject 0.25 mg into the skin once a week.   No facility-administered medications prior to visit.   Reviewed past medical and social history.   ROS per HPI above      Objective:  BP 110/70 (BP Location: Left Arm, Patient Position: Sitting, Cuff Size: Normal)   Pulse 70   Temp 97.9 F (36.6 C) (Temporal)   Resp 16   Ht 5\' 10"  (1.778 m)   Wt 202 lb (91.6 kg)   SpO2 98%   BMI 28.98 kg/m      Physical Exam Vitals and nursing note reviewed.  Cardiovascular:     Rate and Rhythm: Normal rate and regular rhythm.     Pulses: Normal pulses.     Heart sounds: Normal heart sounds.  Pulmonary:     Effort: Pulmonary effort is normal.      Breath sounds: Normal breath sounds.  Abdominal:     General: Bowel sounds are normal. There is no distension.     Palpations: Abdomen is soft. There is no mass.     Tenderness: There is no abdominal tenderness. There is no right CVA tenderness, left CVA tenderness or guarding.  Neurological:     Mental Status: She is alert and oriented to person, place, and time.     No results found for any visits on 02/07/23.    Assessment & Plan:    Problem List Items Addressed This Visit       Other   Obesity    Wegovy dose was increased to 0.5mg  5month ago. Lost 8lbs with medication She developed N/V/D with dose increase. She stopped med 2weeks ago, but symptoms are persistent. Wt Readings from Last 3 Encounters:  02/07/23 202 lb (91.6 kg)  12/05/22 210 lb 6.4 oz (95.4 kg)  11/21/22 210 lb (95.3 kg)    We discussed side effects of Glp-1 injections, need to maintain low fat/low carb diet with medication and daily exercise to promote weight loss. Will check CMP and lipase today, continue to hold wegovy, sent zofran and omeprazole to manage symptoms. F/up as needed      Other Visit Diagnoses  Drug side effects, initial encounter    -  Primary   Relevant Medications   ondansetron (ZOFRAN) 4 MG tablet   omeprazole (PRILOSEC) 40 MG capsule   Other Relevant Orders   Comprehensive metabolic panel   Lipase      Return if symptoms worsen or fail to improve.     Wilfred Lacy, NP

## 2023-02-07 NOTE — Progress Notes (Signed)
Stable Follow instructions as discussed during office visit.

## 2023-02-07 NOTE — Patient Instructions (Signed)
Continue to hold wegovy Maintain low fat/low carb diet Start daily exercise: cardio (walking 10,000steps daily) and weight training (resistant belt or calisthetics) 160mins weekly.

## 2023-02-07 NOTE — Assessment & Plan Note (Signed)
Wegovy dose was increased to 0.5mg  38month ago. Lost 8lbs with medication She developed N/V/D with dose increase. She stopped med 2weeks ago, but symptoms are persistent. Wt Readings from Last 3 Encounters:  02/07/23 202 lb (91.6 kg)  12/05/22 210 lb 6.4 oz (95.4 kg)  11/21/22 210 lb (95.3 kg)    We discussed side effects of Glp-1 injections, need to maintain low fat/low carb diet with medication and daily exercise to promote weight loss. Will check CMP and lipase today, continue to hold wegovy, sent zofran and omeprazole to manage symptoms. F/up as needed

## 2023-02-20 ENCOUNTER — Encounter: Payer: Self-pay | Admitting: Nurse Practitioner

## 2023-02-20 DIAGNOSIS — E6609 Other obesity due to excess calories: Secondary | ICD-10-CM

## 2023-02-21 MED ORDER — WEGOVY 0.25 MG/0.5ML ~~LOC~~ SOAJ: 0.2500 mg | SUBCUTANEOUS | 0 refills | Status: DC

## 2023-02-25 NOTE — Telephone Encounter (Signed)
Caller Name: [name] Call back phone #: [phone]  Reason for Call: Pt called [facility] for the [facility] was told she needs PA. She would like to send to [facility].

## 2023-02-27 ENCOUNTER — Ambulatory Visit: Payer: 59 | Admitting: Radiology

## 2023-02-27 ENCOUNTER — Other Ambulatory Visit: Payer: Self-pay | Admitting: Nurse Practitioner

## 2023-02-27 DIAGNOSIS — Z6830 Body mass index (BMI) 30.0-30.9, adult: Secondary | ICD-10-CM

## 2023-02-27 MED ORDER — WEGOVY 0.25 MG/0.5ML ~~LOC~~ SOAJ
0.2500 mg | SUBCUTANEOUS | 0 refills | Status: DC
  Filled 2023-03-04 (×3): qty 2, 28d supply, fill #0

## 2023-02-27 NOTE — Telephone Encounter (Signed)
Pt LVM in triage line stating that she cancelled her appt for f/u today due to going to see PCP re: these sxs when they were occurring and PCP summed it up for her that she may be just moved up to a higher dosage of the wegovy too quickly. So, pt decided to cease taking so has been off of it for ~1 month now.   Pt now requesting to re-start medication at the lower dosage and is inquiring if you would be willing to prescribe it for her again or if you would want her to get it through her PCP? Please advise.   Last AEX 10/04/2022

## 2023-02-27 NOTE — Telephone Encounter (Signed)
She can get in through either of [place] but will need appt for weight and BP before starting

## 2023-03-03 ENCOUNTER — Other Ambulatory Visit (HOSPITAL_BASED_OUTPATIENT_CLINIC_OR_DEPARTMENT_OTHER): Payer: Self-pay

## 2023-03-03 ENCOUNTER — Other Ambulatory Visit: Payer: Self-pay | Admitting: Nurse Practitioner

## 2023-03-03 DIAGNOSIS — E6609 Other obesity due to excess calories: Secondary | ICD-10-CM

## 2023-03-04 ENCOUNTER — Other Ambulatory Visit: Payer: Self-pay

## 2023-03-04 ENCOUNTER — Other Ambulatory Visit (HOSPITAL_BASED_OUTPATIENT_CLINIC_OR_DEPARTMENT_OTHER): Payer: Self-pay

## 2023-03-05 ENCOUNTER — Other Ambulatory Visit (HOSPITAL_BASED_OUTPATIENT_CLINIC_OR_DEPARTMENT_OTHER): Payer: Self-pay

## 2023-03-05 ENCOUNTER — Other Ambulatory Visit (HOSPITAL_COMMUNITY): Payer: Self-pay

## 2023-03-11 ENCOUNTER — Other Ambulatory Visit (HOSPITAL_COMMUNITY): Payer: Self-pay

## 2023-04-10 ENCOUNTER — Ambulatory Visit: Payer: 59 | Admitting: Nurse Practitioner

## 2023-04-10 ENCOUNTER — Encounter: Payer: Self-pay | Admitting: Nurse Practitioner

## 2023-04-10 VITALS — BP 100/72 | HR 72 | Temp 98.3°F | Resp 16 | Ht 70.0 in | Wt 198.2 lb

## 2023-04-10 DIAGNOSIS — E663 Overweight: Secondary | ICD-10-CM | POA: Diagnosis not present

## 2023-04-10 DIAGNOSIS — Z6829 Body mass index (BMI) 29.0-29.9, adult: Secondary | ICD-10-CM | POA: Diagnosis not present

## 2023-04-10 NOTE — Progress Notes (Signed)
Established Patient Visit  Patient: Lindsey Bishop   DOB: May 11, 1961   61 y.o. Female  MRN: [id number] Visit Date: 04/10/2023  Subjective:    Chief Complaint  Patient presents with   Medication check    She is still having bad side affects- N/V/D    HPI Overweight with body mass index (BMI) of 29 to 29.9 in adult Unable to tolerate wegovy at low dose (nausea, vomiting and diarrhea) Normal lipase and CMP. Unable to use phentermine due to possible effects heart rhythm, unable to use contrave due to DDI with metoprolol. Exercise: walking daily at work, does not count steps. Diet: maintain low fat diet but does not track calorie intake. She last lost total of 13lbs in last 1year. Wt Readings from Last 3 Encounters:  04/10/23 198 lb 3.2 oz (89.9 kg)  02/07/23 202 lb (91.6 kg)  12/05/22 210 lb 6.4 oz (95.4 kg)    She agreed to nutritionist referral Encouraged to Maintain per week of moderate intensity exercise and Maintain 71g of protein daily. Provided printed information on how to increase exercise and count daily calorie intake. F/up prn  BP Readings from Last 3 Encounters:  04/10/23 100/72  02/07/23 110/70  12/05/22 128/88    Reviewed medical, surgical, and social history today  Medications: Outpatient Medications Prior to Visit  Medication Sig   metoprolol tartrate (LOPRESSOR) 25 MG tablet Take 1 tablet (25 mg total) by mouth 2 (two) times daily as needed (for afib).   omeprazole (PRILOSEC) 40 MG capsule Take 1 capsule (40 mg total) by mouth in the morning.   ondansetron (ZOFRAN) 4 MG tablet Take 1 tablet (4 mg total) by mouth every 8 (eight) hours as needed for nausea or vomiting.   [DISCONTINUED] Semaglutide-Weight Management (WEGOVY) 0.25 MG/0.5ML SOAJ Inject 0.25 mg into the skin once a week.   No facility-administered medications prior to visit.   Reviewed past medical and social history.   ROS per HPI above  Last CBC Lab Results   Component Value Date   WBC 6.1 04/29/2022   HGB 12.8 04/29/2022   HCT 37.8 04/29/2022   MCV 89 04/29/2022   MCH 30.2 04/29/2022   RDW 12.8 04/29/2022   PLT 209 04/29/2022   Last metabolic panel Lab Results  Component Value Date   GLUCOSE 82 02/07/2023   NA 145 02/07/2023   K 4.3 02/07/2023   CL 110 02/07/2023   CO2 26 02/07/2023   BUN 10 02/07/2023   CREATININE 0.94 02/07/2023   EGFR 72 04/29/2022   CALCIUM 9.8 02/07/2023   PROT 6.9 02/07/2023   ALBUMIN 4.1 02/07/2023   LABGLOB 2.6 05/29/2017   AGRATIO 1.7 05/29/2017   BILITOT 0.3 02/07/2023   ALKPHOS 87 02/07/2023   AST 17 02/07/2023   ALT 14 02/07/2023   ANIONGAP 3 (L) 09/05/2015   Last lipids Lab Results  Component Value Date   CHOL 169 12/05/2022   HDL 70.40 12/05/2022   LDLCALC 82 12/05/2022   TRIG 82.0 12/05/2022   CHOLHDL 2 12/05/2022   Last thyroid functions Lab Results  Component Value Date   TSH 1.530 05/29/2017   T3TOTAL 113 05/29/2017   Last vitamin D Lab Results  Component Value Date   VD25OH 39.8 09/09/2017      Objective:  BP 100/72 (BP Location: Left Arm, Patient Position: Sitting, Cuff Size: Large)   Pulse 72   Temp 98.3 F (36.8  C) (Temporal)   Resp 16   Ht 5\' 10"  (1.778 m)   Wt 198 lb 3.2 oz (89.9 kg)   SpO2 98%   BMI 28.44 kg/m      Physical Exam Vitals and nursing note reviewed.  Cardiovascular:     Rate and Rhythm: Normal rate.     Pulses: Normal pulses.  Pulmonary:     Effort: Pulmonary effort is normal.  Neurological:     Mental Status: She is alert and oriented to person, place, and time.     No results found for any visits on 04/10/23.    Assessment & Plan:    Problem List Items Addressed This Visit       Other   Overweight with body mass index (BMI) of 29 to 29.9 in adult - Primary    Unable to tolerate wegovy at low dose (nausea, vomiting and diarrhea) Normal lipase and CMP. Unable to use phentermine due to possible effects heart rhythm, unable to  use contrave due to DDI with metoprolol. Exercise: walking daily at work, does not count steps. Diet: maintain low fat diet but does not track calorie intake. She last lost total of 13lbs in last 1year. Wt Readings from Last 3 Encounters:  04/10/23 198 lb 3.2 oz (89.9 kg)  02/07/23 202 lb (91.6 kg)  12/05/22 210 lb 6.4 oz (95.4 kg)    She agreed to nutritionist referral Encouraged to Maintain per week of moderate intensity exercise and Maintain 71g of protein daily. Provided printed information on how to increase exercise and count daily calorie intake. F/up prn      Return if symptoms worsen or fail to improve.     Alysia Penna, NP

## 2023-04-10 NOTE — Patient Instructions (Addendum)
Maintain per week of moderate intensity exercise Maintain 71g of protein daily.  Calorie Counting for Weight Loss Calories are units of energy. Your body needs a certain number of calories from food to keep going throughout the day. When you eat or drink more calories than your body needs, your body stores the extra calories mostly as fat. When you eat or drink fewer calories than your body needs, your body burns fat to get the energy it needs. Calorie counting means keeping track of how many calories you eat and drink each day. Calorie counting can be helpful if you need to lose weight. If you eat fewer calories than your body needs, you should lose weight. Ask your health care provider what a healthy weight is for you. For calorie counting to work, you will need to eat the right number of calories each day to lose a healthy amount of weight per week. A dietitian can help you figure out how many calories you need in a day and will suggest ways to reach your calorie goal. A healthy amount of weight to lose each week is usually 1-2 lb (0.5-0.9 kg). This usually means that your daily calorie intake should be reduced by 500-750 calories. Eating 1,200-1,500 calories a day can help most women lose weight. Eating 1,500-1,800 calories a day can help most men lose weight. What do I need to know about calorie counting? Work with your health care provider or dietitian to determine how many calories you should get each day. To meet your daily calorie goal, you will need to: Find out how many calories are in each food that you would like to eat. Try to do this before you eat. Decide how much of the food you plan to eat. Keep a food log. Do this by writing down what you ate and how many calories it had. To successfully lose weight, it is important to balance calorie counting with a healthy lifestyle that includes regular activity. Where do I find calorie information?  The number of calories in a food  can be found on a Nutrition Facts label. If a food does not have a Nutrition Facts label, try to look up the calories online or ask your dietitian for help. Remember that calories are listed per serving. If you choose to have more than one serving of a food, you will have to multiply the calories per serving by the number of servings you plan to eat. For example, the label on a package of bread might say that a serving size is 1 slice and that there are 90 calories in a serving. If you eat 1 slice, you will have eaten 90 calories. If you eat 2 slices, you will have eaten 180 calories. How do I keep a food log? After each time that you eat, record the following in your food log as soon as possible: What you ate. Be sure to include toppings, sauces, and other extras on the food. How much you ate. This can be measured in cups, ounces, or number of items. How many calories were in each food and drink. The total number of calories in the food you ate. Keep your food log near you, such as in a pocket-sized notebook or on an app or website on your mobile phone. Some programs will calculate calories for you and show you how many calories you have left to meet your daily goal. What are some portion-control tips? Know how many calories are in a serving.  This will help you know how many servings you can have of a certain food. Use a measuring cup to measure serving sizes. You could also try weighing out portions on a kitchen scale. With time, you will be able to estimate serving sizes for some foods. Take time to put servings of different foods on your favorite plates or in your favorite bowls and cups so you know what a serving looks like. Try not to eat straight from a food's packaging, such as from a bag or box. Eating straight from the package makes it hard to see how much you are eating and can lead to overeating. Put the amount you would like to eat in a cup or on a plate to make sure you are eating the  right portion. Use smaller plates, glasses, and bowls for smaller portions and to prevent overeating. Try not to multitask. For example, avoid watching TV or using your computer while eating. If it is time to eat, sit down at a table and enjoy your food. This will help you recognize when you are full. It will also help you be more mindful of what and how much you are eating. What are tips for following this plan? Reading food labels Check the calorie count compared with the serving size. The serving size may be smaller than what you are used to eating. Check the source of the calories. Try to choose foods that are high in protein, fiber, and vitamins, and low in saturated fat, trans fat, and sodium. Shopping Read nutrition labels while you shop. This will help you make healthy decisions about which foods to buy. Pay attention to nutrition labels for low-fat or fat-free foods. These foods sometimes have the same number of calories or more calories than the full-fat versions. They also often have added sugar, starch, or salt to make up for flavor that was removed with the fat. Make a grocery list of lower-calorie foods and stick to it. Cooking Try to cook your favorite foods in a healthier way. For example, try baking instead of frying. Use low-fat dairy products. Meal planning Use more fruits and vegetables. One-half of your plate should be fruits and vegetables. Include lean proteins, such as chicken, [place], and fish. Lifestyle Each week, aim to do one of the following: 150 minutes of moderate exercise, such as walking. 75 minutes of vigorous exercise, such as running. General information Know how many calories are in the foods you eat most often. This will help you calculate calorie counts faster. Find a way of tracking calories that works for you. Get creative. Try different apps or programs if writing down calories does not work for you. What foods should I eat?  Eat nutritious foods.  It is better to have a nutritious, high-calorie food, such as an avocado, than a food with few nutrients, such as a bag of potato chips. Use your calories on foods and drinks that will fill you up and will not leave you hungry soon after eating. Examples of foods that fill you up are nuts and nut butters, vegetables, lean proteins, and high-fiber foods such as whole grains. High-fiber foods are foods with more than 5 g of fiber per serving. Pay attention to calories in drinks. Low-calorie drinks include water and unsweetened drinks. The items listed above may not be a complete list of foods and beverages you can eat. Contact a dietitian for more information. What foods should I limit? Limit foods or drinks that are not good  sources of vitamins, minerals, or protein or that are high in unhealthy fats. These include: Candy. Other sweets. Sodas, specialty coffee drinks, alcohol, and juice. The items listed above may not be a complete list of foods and beverages you should avoid. Contact a dietitian for more information. How do I count calories when eating out? Pay attention to portions. Often, portions are much larger when eating out. Try these tips to keep portions smaller: Consider sharing a meal instead of getting your own. If you get your own meal, eat only half of it. Before you start eating, ask for a container and put half of your meal into it. When available, consider ordering smaller portions from the menu instead of full portions. Pay attention to your food and drink choices. Knowing the way food is cooked and what is included with the meal can help you eat fewer calories. If calories are listed on the menu, choose the lower-calorie options. Choose dishes that include vegetables, fruits, whole grains, low-fat dairy products, and lean proteins. Choose items that are boiled, broiled, grilled, or steamed. Avoid items that are buttered, battered, fried, or served with cream sauce. Items labeled  as crispy are usually fried, unless stated otherwise. Choose water, low-fat milk, unsweetened iced tea, or other drinks without added sugar. If you want an alcoholic beverage, choose a lower-calorie option, such as a glass of wine or light beer. Ask for dressings, sauces, and syrups on the side. These are usually high in calories, so you should limit the amount you eat. If you want a salad, choose a garden salad and ask for grilled meats. Avoid extra toppings such as bacon, cheese, or fried items. Ask for the dressing on the side, or ask for olive oil and vinegar or lemon to use as dressing. Estimate how many servings of a food you are given. Knowing serving sizes will help you be aware of how much food you are eating at restaurants. Where to find more information Centers for Disease Control and Prevention: FootballExhibition.com.br U.S. Department of Agriculture: WrestlingReporter.dk Summary Calorie counting means keeping track of how many calories you eat and drink each day. If you eat fewer calories than your body needs, you should lose weight. A healthy amount of weight to lose per week is usually 1-2 lb (0.5-0.9 kg). This usually means reducing your daily calorie intake by 500-750 calories. The number of calories in a food can be found on a Nutrition Facts label. If a food does not have a Nutrition Facts label, try to look up the calories online or ask your dietitian for help. Use smaller plates, glasses, and bowls for smaller portions and to prevent overeating. Use your calories on foods and drinks that will fill you up and not leave you hungry shortly after a meal. This information is not intended to replace advice given to you by your health care provider. Make sure you discuss any questions you have with your health care provider. Document Revised: 12/16/2019 Document Reviewed: 12/16/2019 Elsevier Patient Education  2023 [employer].   Exercising to Stay Healthy To become healthy and stay healthy, it is  recommended that you do moderate-intensity and vigorous-intensity exercise. You can tell that you are exercising at a moderate intensity if your heart starts beating faster and you start breathing faster but can still hold a conversation. You can tell that you are exercising at a vigorous intensity if you are breathing much harder and faster and cannot hold a conversation while exercising. How can exercise  benefit me? Exercising regularly is important. It has many health benefits, such as: Improving overall fitness, flexibility, and endurance. Increasing bone density. Helping with weight control. Decreasing body fat. Increasing muscle strength and endurance. Reducing stress and tension, anxiety, depression, or anger. Improving overall health. What guidelines should I follow while exercising? Before you start a new exercise program, talk with your health care provider. Do not exercise so much that you hurt yourself, feel dizzy, or get very short of breath. Wear comfortable clothes and wear shoes with good support. Drink plenty of water while you exercise to prevent dehydration or heat stroke. Work out until your breathing and your heartbeat get faster (moderate intensity). How often should I exercise? Choose an activity that you enjoy, and set realistic goals. Your health care provider can help you make an activity plan that is individually designed and works best for you. Exercise regularly as told by your health care provider. This may include: Doing strength training two times a week, such as: Lifting weights. Using resistance bands. Push-ups. Sit-ups. Yoga. Doing a certain intensity of exercise for a given amount of time. Choose from these options: A total of 150 minutes of moderate-intensity exercise every week. A total of 75 minutes of vigorous-intensity exercise every week. A mix of moderate-intensity and vigorous-intensity exercise every week. Children, pregnant women, people who  have not exercised regularly, people who are overweight, and older adults may need to talk with a health care provider about what activities are safe to perform. If you have a medical condition, be sure to talk with your health care provider before you start a new exercise program. What are some exercise ideas? Moderate-intensity exercise ideas include: Walking 1 mile (1.6 km) in about 15 minutes. Biking. Hiking. Golfing. Dancing. Water aerobics. Vigorous-intensity exercise ideas include: Walking 4.5 miles (7.2 km) or more in about 1 hour. Jogging or running 5 miles (8 km) in about 1 hour. Biking 10 miles (16.1 km) or more in about 1 hour. Lap swimming. Roller-skating or in-line skating. Cross-country skiing. Vigorous competitive sports, such as football, basketball, and soccer. Jumping rope. Aerobic dancing. What are some everyday activities that can help me get exercise? Yard work, such as: Child psychotherapist. Raking and bagging leaves. Washing your car. Pushing a stroller. Shoveling snow. Gardening. Washing windows or floors. How can I be more active in my day-to-day activities? Use stairs instead of an elevator. Take a walk during your lunch break. If you drive, park your car farther away from your work or school. If you take public transportation, get off one stop early and walk the rest of the way. Stand up or walk around during all of your indoor phone calls. Get up, stretch, and walk around every 30 minutes throughout the day. Enjoy exercise with a friend. Support to continue exercising will help you keep a regular routine of activity. Where to find more information You can find more information about exercising to stay healthy from: U.S. Department of Health and Human Services: ThisPath.fi Centers for Disease Control and Prevention (CDC): FootballExhibition.com.br Summary Exercising regularly is important. It will improve your overall fitness, flexibility, and  endurance. Regular exercise will also improve your overall health. It can help you control your weight, reduce stress, and improve your bone density. Do not exercise so much that you hurt yourself, feel dizzy, or get very short of breath. Before you start a new exercise program, talk with your health care provider. This information is not intended to replace advice  given to you by your health care provider. Make sure you discuss any questions you have with your health care provider. Document Revised: 03/02/2021 Document Reviewed: 03/02/2021 Elsevier Patient Education  2023 [employer].

## 2023-04-10 NOTE — Assessment & Plan Note (Signed)
Unable to tolerate wegovy at low dose (nausea, vomiting and diarrhea) Normal lipase and CMP. Unable to use phentermine due to possible effects heart rhythm, unable to use contrave due to DDI with metoprolol. Exercise: walking daily at work, does not count steps. Diet: maintain low fat diet but does not track calorie intake. She last lost total of 13lbs in last 1year. Wt Readings from Last 3 Encounters:  04/10/23 198 lb 3.2 oz (89.9 kg)  02/07/23 202 lb (91.6 kg)  12/05/22 210 lb 6.4 oz (95.4 kg)    She agreed to nutritionist referral Encouraged to Maintain per week of moderate intensity exercise and Maintain 71g of protein daily. Provided printed information on how to increase exercise and count daily calorie intake. F/up prn

## 2023-07-22 ENCOUNTER — Encounter: Payer: Self-pay | Admitting: Nurse Practitioner

## 2023-07-22 ENCOUNTER — Telehealth: Payer: Self-pay | Admitting: Nurse Practitioner

## 2023-07-22 NOTE — Telephone Encounter (Signed)
 Called and left Lindsey Bishop's instructions on her voice mail.

## 2023-07-22 NOTE — Telephone Encounter (Signed)
Caller Name: [name], pt Call back phone #: [phone]  Reason for Call: requesting RX for Zepbound that she says is covered by [facility] under Tier III. Pt said she was not able to tolerate Wegovy (nausea & vomitting).   [facility] - [place], [place] - [address] Phone: [phone]  Fax: [phone]

## 2023-08-21 ENCOUNTER — Other Ambulatory Visit: Payer: Self-pay | Admitting: Radiology

## 2023-08-21 DIAGNOSIS — Z1231 Encounter for screening mammogram for malignant neoplasm of breast: Secondary | ICD-10-CM

## 2023-10-07 ENCOUNTER — Ambulatory Visit: Payer: 59 | Admitting: Radiology

## 2023-11-13 ENCOUNTER — Other Ambulatory Visit (HOSPITAL_BASED_OUTPATIENT_CLINIC_OR_DEPARTMENT_OTHER): Payer: Self-pay

## 2023-11-13 ENCOUNTER — Encounter: Payer: Self-pay | Admitting: Radiology

## 2023-11-13 ENCOUNTER — Telehealth: Payer: Self-pay

## 2023-11-13 ENCOUNTER — Ambulatory Visit: Payer: 59 | Admitting: Radiology

## 2023-11-13 VITALS — BP 98/60 | HR 62 | Ht 70.0 in | Wt 210.0 lb

## 2023-11-13 DIAGNOSIS — Z6830 Body mass index (BMI) 30.0-30.9, adult: Secondary | ICD-10-CM

## 2023-11-13 DIAGNOSIS — Z01419 Encounter for gynecological examination (general) (routine) without abnormal findings: Secondary | ICD-10-CM

## 2023-11-13 MED ORDER — ZEPBOUND 2.5 MG/0.5ML ~~LOC~~ SOAJ
2.5000 mg | SUBCUTANEOUS | 0 refills | Status: DC
  Filled 2023-11-13: qty 2, 28d supply, fill #0

## 2023-11-13 NOTE — Telephone Encounter (Signed)
Prior authorization request received from covermymeds for Zepbound 2.5 mg.  PA initiated.  Patient notified. KEY: [id number] DX: [id number] morbid obesity BMI:  30.13

## 2023-11-13 NOTE — Progress Notes (Signed)
   Lindsey Bishop 09/28/1961 [id number]   History: Postmenopausal 62 y.o. presents for annual exam. C/o worsening weight gain. Has a hx of afib, trouble keeping her weight down with diet and exercise. Tried MVHQIO x69months earlier this year but had to stop due to continued severed vomiting and diarrhea. Interested in trying Zepbound.    Gynecologic History Postmenopausal Last Pap: 2023. Results were: normal Last mammogram: 11/25/22. Results were: normal Last colonoscopy: 03/26/22   Obstetric History OB History  Gravida Para Term Preterm AB Living  4 4 4   4   SAB IAB Ectopic Multiple Live Births          # Outcome Date GA Lbr Len/2nd Weight Sex Type Anes PTL Lv  4 Term           3 Term           2 Term           1 Term              The following portions of the patient's history were reviewed and updated as appropriate: allergies, current medications, past family history, past medical history, past social history, past surgical history, and problem list.  Review of Systems Pertinent items noted in HPI and remainder of comprehensive ROS otherwise negative.  Past medical history, past surgical history, family history and social history were all reviewed and documented in the EPIC chart.  Exam:  Vitals:   11/13/23 1201  BP: 98/60  Pulse: 62  SpO2: 90%  Weight: 210 lb (95.3 kg)  Height: 5\' 10"  (1.778 m)   Body mass index is 30.13 kg/m.  General appearance:  Normal Thyroid:  Symmetrical, normal in size, without palpable masses or nodularity. Respiratory  Auscultation:  Clear without wheezing or rhonchi Cardiovascular  Auscultation:  Regular rate, without rubs, murmurs or gallops  Edema/varicosities:  Not grossly evident Abdominal  Soft,nontender, without masses, guarding or rebound.  Liver/spleen:  No organomegaly noted  Hernia:  None appreciated  Skin  Inspection:  Grossly normal Breasts: Examined lying and sitting.   Right: Without masses, retractions, nipple  discharge or axillary adenopathy.   Left: Without masses, retractions, nipple discharge or axillary adenopathy. Genitourinary   Inguinal/mons:  Normal without inguinal adenopathy  External genitalia:  Normal appearing vulva with no masses, tenderness, or lesions  BUS/Urethra/Skene's glands:  Normal  Vagina:  Normal appearing with normal color and discharge, no lesions. Atrophy: mild   Cervix:  Normal appearing without discharge or lesions  Uterus:  Normal in size, shape and contour.  Midline and mobile, nontender  Adnexa/parametria:     Rt: Normal in size, without masses or tenderness.   Lt: Normal in size, without masses or tenderness.  Anus and perineum: Normal    Raynelle Fanning, CMA present for exam  Assessment/Plan:   1. Well woman exam with routine gynecological exam (Primary) Pap 2026  2. Morbid obesity (HCC)  3. BMI 30.0-30.9,adult  - tirzepatide (ZEPBOUND) 2.5 MG/0.5ML Pen; Inject 2.5 mg into the skin once a week.  Dispense: 2 mL; Refill: 0    Discussed SBE, colonoscopy and DEXA screening as directed. Recommend of exercise weekly, including weight bearing exercise. Encouraged the use of seatbelts and sunscreen.  Return in 1 year for annual or sooner prn.  Tanda Rockers WHNP-BC, 12:35 PM 11/13/2023

## 2023-11-14 ENCOUNTER — Other Ambulatory Visit (HOSPITAL_BASED_OUTPATIENT_CLINIC_OR_DEPARTMENT_OTHER): Payer: Self-pay

## 2023-11-14 NOTE — Telephone Encounter (Signed)
PA approved for Zepbound. Patient notified.

## 2023-11-27 ENCOUNTER — Ambulatory Visit
Admission: RE | Admit: 2023-11-27 | Discharge: 2023-11-27 | Disposition: A | Discharge status: Home / Self Care | Payer: 59 | Source: Ambulatory Visit | Attending: Radiology | Admitting: Radiology

## 2023-11-27 DIAGNOSIS — Z1231 Encounter for screening mammogram for malignant neoplasm of breast: Secondary | ICD-10-CM

## 2023-12-04 ENCOUNTER — Ambulatory Visit: Payer: 59 | Admitting: Radiology

## 2023-12-04 ENCOUNTER — Encounter: Payer: Self-pay | Admitting: Radiology

## 2023-12-04 DIAGNOSIS — Z6830 Body mass index (BMI) 30.0-30.9, adult: Secondary | ICD-10-CM

## 2023-12-04 MED ORDER — ZEPBOUND 5 MG/0.5ML ~~LOC~~ SOAJ
5.0000 mg | SUBCUTANEOUS | 1 refills | Status: DC
Start: 2023-12-04 — End: 2024-01-07

## 2023-12-04 NOTE — Progress Notes (Signed)
   Lindsey Bishop [birth year] [id number]   History: Postmenopausal 63 y.o. presents for follow up after using Zepbound x 4 weeks. She has lost 11lbs according to our scale, 2 additonal lbs per home scale. No side effects, Tolerating well. Would like to move to next dose. Continuing to exercise regularly.    Past Medical History:  Diagnosis Date   AKI (acute kidney injury) (HCC) 12/24/2014   Class 1 obesity without serious comorbidity with body mass index (BMI) of 30.0 to 30.9 in adult 07/08/2017   patient BMI >30 when she bagan the program   Demand ischemia of myocardium - related to Afib RVR 12/24/2014   Hemorrhoid    Hx of echocardiogram    a. Echo (11/15):  EF 55-60%, no RWMA, normal diastolic function, normal RVF, mild TR, LA 33 mm   Insulin resistance 09/09/2017   Leukocytosis 12/24/2014   Obesity (BMI 30.0-34.9) 12/24/2014   PAF (paroxysmal atrial fibrillation) (HCC)    a. Multaq 4/11 >>> acute hepatitis (AST 1215, ALT 1343) - Multaq DC'd   Polyp, corpus uteri    Viral URI with cough 12/24/2014   Vitamin D deficiency 07/08/2017     Obstetric History OB History  Gravida Para Term Preterm AB Living  4 4 4   4   SAB IAB Ectopic Multiple Live Births          # Outcome Date GA Lbr Len/2nd Weight Sex Type Anes PTL Lv  4 Term           3 Term           2 Term           1 Term              The following portions of the patient's history were reviewed and updated as appropriate: allergies, current medications, past family history, past medical history, past social history, past surgical history, and problem list.  Review of Systems Pertinent items noted in HPI and remainder of comprehensive ROS otherwise negative.  Past medical history, past surgical history, family history and social history were all reviewed and documented in the EPIC chart.  Exam:  Vitals:   12/04/23 0806  BP: 112/68  Pulse: 72  SpO2: 96%  Weight: 199 lb (90.3 kg)   Body mass index is 28.55  kg/m.  Physical Exam Vitals reviewed.  Constitutional:      Appearance: Normal appearance. She is obese.  Pulmonary:     Effort: Pulmonary effort is normal.  Abdominal:     General: Abdomen is flat.     Palpations: Abdomen is soft.  Neurological:     Mental Status: She is alert.  Psychiatric:        Mood and Affect: Mood normal.        Thought Content: Thought content normal.        Judgment: Judgment normal.     Starting weight: 210lbs Current Weight: 199 Goal weight: 170lbs  Assessment/Plan:   1. Morbid obesity (HCC) (Primary) - tirzepatide (ZEPBOUND) 5 MG/0.5ML Pen; Inject 5 mg into the skin once a week.  Dispense: 2 mL; Refill: 1  2. BMI 30.0-30.9,adult  - tirzepatide (ZEPBOUND) 5 MG/0.5ML Pen; Inject 5 mg into the skin once a week.  Dispense: 2 mL; Refill: 1    Follow up 4 weeks for med check  Tanda Rockers WHNP-BC, 8:46 AM 12/04/2023

## 2023-12-06 ENCOUNTER — Other Ambulatory Visit: Payer: Self-pay | Admitting: Cardiology

## 2024-01-07 ENCOUNTER — Encounter: Payer: Self-pay | Admitting: Radiology

## 2024-01-07 ENCOUNTER — Ambulatory Visit: Payer: 59 | Admitting: Radiology

## 2024-01-07 VITALS — BP 100/62 | Wt 190.8 lb

## 2024-01-07 DIAGNOSIS — Z6830 Body mass index (BMI) 30.0-30.9, adult: Secondary | ICD-10-CM

## 2024-01-07 MED ORDER — ZEPBOUND 7.5 MG/0.5ML ~~LOC~~ SOAJ: 7.5000 mg | SUBCUTANEOUS | 0 refills | Status: DC

## 2024-01-07 NOTE — Progress Notes (Signed)
   Lindsey Bishop 19-Dec-1960 [id number]   History: Postmenopausal 63 y.o. presents for follow up after using Zepbound x 8 weeks. She has lost 20lbs according to our scale, 2 additonal lbs per home scale. No side effects, Tolerating well. Would like to move to next dose. Continuing to exercise regularly.    Past Medical History:  Diagnosis Date   AKI (acute kidney injury) (HCC) 12/24/2014   Class 1 obesity without serious comorbidity with body mass index (BMI) of 30.0 to 30.9 in adult 07/08/2017   patient BMI >30 when she bagan the program   Demand ischemia of myocardium - related to Afib RVR 12/24/2014   Hemorrhoid    Hx of echocardiogram    a. Echo (11/15):  EF 55-60%, no RWMA, normal diastolic function, normal RVF, mild TR, LA 33 mm   Insulin resistance 09/09/2017   Leukocytosis 12/24/2014   Obesity (BMI 30.0-34.9) 12/24/2014   PAF (paroxysmal atrial fibrillation) (HCC)    a. Multaq 4/11 >>> acute hepatitis (AST 1215, ALT 1343) - Multaq DC'd   Polyp, corpus uteri    Viral URI with cough 12/24/2014   Vitamin D deficiency 07/08/2017     Obstetric History OB History  Gravida Para Term Preterm AB Living  4 4 4   4   SAB IAB Ectopic Multiple Live Births          # Outcome Date GA Lbr Len/2nd Weight Sex Type Anes PTL Lv  4 Term           3 Term           2 Term           1 Term              The following portions of the patient's history were reviewed and updated as appropriate: allergies, current medications, past family history, past medical history, past social history, past surgical history, and problem list.  Review of Systems Pertinent items noted in HPI and remainder of comprehensive ROS otherwise negative.  Past medical history, past surgical history, family history and social history were all reviewed and documented in the EPIC chart.  Exam:  Vitals:   01/07/24 1056  BP: 100/62  Weight: 190 lb 12.8 oz (86.5 kg)   Body mass index is 27.38 kg/m.  Physical  Exam Vitals reviewed.  Constitutional:      Appearance: Normal appearance. She is obese.  Pulmonary:     Effort: Pulmonary effort is normal.  Abdominal:     General: Abdomen is flat.     Palpations: Abdomen is soft.  Neurological:     Mental Status: She is alert.  Psychiatric:        Mood and Affect: Mood normal.        Thought Content: Thought content normal.        Judgment: Judgment normal.     Starting weight: 210lbs Current Weight: 190lbs Goal weight: 170lbs  Assessment/Plan:   1. BMI 30.0-30.9,adult (Primary) - tirzepatide (ZEPBOUND) 7.5 MG/0.5ML Pen; Inject 7.5 mg into the skin once a week.  Dispense: 2 mL; Refill: 0  2. Morbid obesity (HCC) - tirzepatide (ZEPBOUND) 7.5 MG/0.5ML Pen; Inject 7.5 mg into the skin once a week.  Dispense: 2 mL; Refill: 0     Follow up 4 weeks for med check  Lindsey Bishop B WHNP-BC, 11:08 AM 01/07/2024

## 2024-01-15 ENCOUNTER — Telehealth: Payer: Self-pay | Admitting: Nurse Practitioner

## 2024-01-15 NOTE — Telephone Encounter (Signed)
 Copied from CRM [id number]. Topic: Appointments - Scheduling Inquiry for Clinic >> Jan 15, 2024 10:03 AM Lindsey Bishop wrote: Reason for CRM: Patient requesting Annual Physical with Dr. Elease Etienne as it is currently time for the next one. System not allowing to schedule until December. Callback [phone]

## 2024-01-15 NOTE — Telephone Encounter (Signed)
 Patient is scheduled for 02/19/24 at 1 PM

## 2024-01-21 NOTE — Progress Notes (Unsigned)
  Electrophysiology Office Note:   Date:  01/22/2024  ID:  Lindsey Bishop, DOB 06-28-1961, MRN [id number]  Primary Cardiologist: None Electrophysiologist: Will Jorja Loa, MD      History of Present Illness:   Lindsey Bishop is a 63 y.o. female with h/o PAF and SVT seen today for routine electrophysiology followup.   Since last being seen in our clinic the patient reports doing well overall. She has palpitations about once a month, sometime waking her up and lasting through the night.  Have all been contained to < 24 hrs. She does take her prn lopressor during these episodes with variable results. Hesitant to commit to another ablation as the first 2 were not effective. Otherwise,  she denies chest pain,  dyspnea, PND, orthopnea, nausea, vomiting, dizziness, syncope, edema, weight gain, or early satiety.   Review of systems complete and found to be negative unless listed in HPI.   EP Information / Studies Reviewed:    EKG is ordered today. Personal review as below.  EKG Interpretation Date/Time:  Thursday January 22 2024 11:52:21 EST Ventricular Rate:  80 PR Interval:  124 QRS Duration:  84 QT Interval:  370 QTC Calculation: 426 R Axis:   71  Text Interpretation: Normal sinus rhythm Normal ECG When compared with ECG of 20-Jun-2022 15:10, No significant change was found Confirmed by Maxine Glenn [phone]) on 01/22/2024 12:03:40 PM    Arrhythmia/Device History S/p SVT and AF ablation 2016 (PVI and ORT)  S/p redo PVI 05/24/2022   Physical Exam:   VS:  BP 102/84   Pulse 80   Ht 5\' 11"  (1.803 m)   Wt 188 lb (85.3 kg)   SpO2 98%   BMI 26.22 kg/m    Wt Readings from Last 3 Encounters:  01/22/24 188 lb (85.3 kg)  01/07/24 190 lb 12.8 oz (86.5 kg)  12/04/23 199 lb (90.3 kg)     GEN: No acute distress NECK: No JVD; No carotid bruits CARDIAC: Regular rate and rhythm, no murmurs, rubs, gallops RESPIRATORY:  Clear to auscultation without rales, wheezing or rhonchi  ABDOMEN:  Soft, non-tender, non-distended EXTREMITIES:  No edema; No deformity   ASSESSMENT AND PLAN:    Paroxysmal AF EKG today shows NSR S/p ablation 2016 and redo 2023 Recurrent on monitor 2/24 (5% burden). She identified alcohol as a probably cause and declined med adjustment or redo ablation consideration. CHA2DS2/VASc is only 1. Not currently on OAC.   Add toprol 12.5 mg at bedtime.  Discussed pulse field ablation at length, pt wishes to hold off for now. Does not wish to re-challenge flecainide. Will call if symptoms worsen or do not improve on low dose toprol.  SVT S/p ablation in 2016 with concealed pathway ORT.  No obvious recurrence   Follow up with Dr. Elberta Fortis in 6 months, sooner depending on symptoms.   Signed, Graciella Freer, PA-C

## 2024-01-22 ENCOUNTER — Ambulatory Visit: Payer: 59 | Attending: Student | Admitting: Student

## 2024-01-22 ENCOUNTER — Encounter: Payer: Self-pay | Admitting: Student

## 2024-01-22 VITALS — BP 102/84 | HR 80 | Ht 71.0 in | Wt 188.0 lb

## 2024-01-22 DIAGNOSIS — I480 Paroxysmal atrial fibrillation: Secondary | ICD-10-CM | POA: Diagnosis not present

## 2024-01-22 DIAGNOSIS — I471 Supraventricular tachycardia, unspecified: Secondary | ICD-10-CM | POA: Diagnosis not present

## 2024-01-22 MED ORDER — METOPROLOL SUCCINATE ER 25 MG PO TB24: 12.5000 mg | ORAL_TABLET | Freq: Every day | ORAL | 3 refills | Status: DC

## 2024-01-22 NOTE — Patient Instructions (Signed)
 Medication Instructions:  Start metoprolol succinate (Toprol XL) 12.5 mg daily at bedtime(this will be 1/2 of the 25 mg tablet) *If you need a refill on your cardiac medications before your next appointment, please call your pharmacy*  Lab Work: None ordered If you have labs (blood work) drawn today and your tests are completely normal, you will receive your results only by: MyChart Message (if you have MyChart) OR A paper copy in the mail If you have any lab test that is abnormal or we need to change your treatment, we will call you to review the results.  Follow-Up: At [facility], you and your health needs are our priority.  As part of our continuing mission to provide you with exceptional heart care, we have created designated Provider Care Teams.  These Care Teams include your primary Cardiologist (physician) and Advanced Practice Providers (APPs -  Physician Assistants and Nurse Practitioners) who all work together to provide you with the care you need, when you need it.  Your next appointment:   6 month(s)  Provider:   Loman Brooklyn, MD

## 2024-02-04 ENCOUNTER — Ambulatory Visit: Payer: 59 | Admitting: Radiology

## 2024-02-04 VITALS — BP 116/78 | HR 84 | Wt 182.6 lb

## 2024-02-04 DIAGNOSIS — Z6830 Body mass index (BMI) 30.0-30.9, adult: Secondary | ICD-10-CM | POA: Diagnosis not present

## 2024-02-04 NOTE — Progress Notes (Signed)
   Lindsey Bishop [birth year] [id number]   History: Postmenopausal 62 y.o. presents for follow up after using Zepbound x 12 weeks. She has lost 28lbs according to our scale, 2 additonal lbs per home scale. No side effects, Tolerating well. Would like to move to next dose. Continuing to exercise regularly.    Past Medical History:  Diagnosis Date   AKI (acute kidney injury) (HCC) 12/24/2014   Class 1 obesity without serious comorbidity with body mass index (BMI) of 30.0 to 30.9 in adult 07/08/2017   patient BMI >30 when she bagan the program   Demand ischemia of myocardium - related to Afib RVR 12/24/2014   Hemorrhoid    Hx of echocardiogram    a. Echo (11/15):  EF 55-60%, no RWMA, normal diastolic function, normal RVF, mild TR, LA 33 mm   Insulin resistance 09/09/2017   Leukocytosis 12/24/2014   Obesity (BMI 30.0-34.9) 12/24/2014   PAF (paroxysmal atrial fibrillation) (HCC)    a. Multaq 4/11 >>> acute hepatitis (AST 1215, ALT 1343) - Multaq DC'd   Polyp, corpus uteri    Viral URI with cough 12/24/2014   Vitamin D deficiency 07/08/2017     Obstetric History OB History  Gravida Para Term Preterm AB Living  4 4 4   4   SAB IAB Ectopic Multiple Live Births          # Outcome Date GA Lbr Len/2nd Weight Sex Type Anes PTL Lv  4 Term           3 Term           2 Term           1 Term              The following portions of the patient's history were reviewed and updated as appropriate: allergies, current medications, past family history, past medical history, past social history, past surgical history, and problem list.  Review of Systems Pertinent items noted in HPI and remainder of comprehensive ROS otherwise negative.  Past medical history, past surgical history, family history and social history were all reviewed and documented in the EPIC chart.  Exam:  Vitals:   02/04/24 1453  BP: 116/78  Pulse: 84  SpO2: 95%  Weight: 182 lb 9.6 oz (82.8 kg)   Body mass index is  25.47 kg/m.  Physical Exam Vitals reviewed.  Constitutional:      Appearance: Normal appearance. She is obese.  Pulmonary:     Effort: Pulmonary effort is normal.  Abdominal:     General: Abdomen is flat.     Palpations: Abdomen is soft.  Neurological:     Mental Status: She is alert.  Psychiatric:        Mood and Affect: Mood normal.        Thought Content: Thought content normal.        Judgment: Judgment normal.     Starting weight: 210lbs Current Weight:182lbs Goal weight: 170lbs  Assessment/Plan:   1. BMI 30.0-30.9,adult (Primary) - tirzepatide (ZEPBOUND) 7.5 MG/0.5ML Pen; Inject 7.5 mg into the skin once a week.  Dispense: 2 mL; Refill: 0   Follow up 4 weeks for med check  Lindsey Bishop B WHNP-BC, 2:55 PM 02/04/2024

## 2024-02-19 ENCOUNTER — Ambulatory Visit (INDEPENDENT_AMBULATORY_CARE_PROVIDER_SITE_OTHER): Payer: 59 | Admitting: Nurse Practitioner

## 2024-02-19 ENCOUNTER — Encounter: Payer: Self-pay | Admitting: Nurse Practitioner

## 2024-02-19 VITALS — BP 100/82 | HR 71 | Temp 97.7°F | Ht 70.0 in | Wt 180.6 lb

## 2024-02-19 DIAGNOSIS — Z0000 Encounter for general adult medical examination without abnormal findings: Secondary | ICD-10-CM | POA: Diagnosis not present

## 2024-02-19 DIAGNOSIS — Z780 Asymptomatic menopausal state: Secondary | ICD-10-CM | POA: Diagnosis not present

## 2024-02-19 DIAGNOSIS — I480 Paroxysmal atrial fibrillation: Secondary | ICD-10-CM

## 2024-02-19 DIAGNOSIS — Z136 Encounter for screening for cardiovascular disorders: Secondary | ICD-10-CM | POA: Diagnosis not present

## 2024-02-19 DIAGNOSIS — E559 Vitamin D deficiency, unspecified: Secondary | ICD-10-CM | POA: Diagnosis not present

## 2024-02-19 DIAGNOSIS — Z13220 Encounter for screening for lipoid disorders: Secondary | ICD-10-CM

## 2024-02-19 LAB — LIPID PANEL
Cholesterol: 158 mg/dL (ref 0–200)
HDL: 49 mg/dL (ref >39.00)
LDL Cholesterol: 93 mg/dL (ref 0–99)
NonHDL: 108.77
Total CHOL/HDL Ratio: 3
Triglycerides: 80 mg/dL (ref 0.0–149.0)
VLDL: 16 mg/dL (ref 0.0–40.0)

## 2024-02-19 LAB — COMPREHENSIVE METABOLIC PANEL WITH GFR
ALT: 13 U/L (ref 0–35)
AST: 17 U/L (ref 0–37)
Albumin: 4.2 g/dL (ref 3.5–5.2)
Alkaline Phosphatase: 49 U/L (ref 39–117)
BUN: 19 mg/dL (ref 6–23)
CO2: 25 meq/L (ref 19–32)
Calcium: 9.9 mg/dL (ref 8.4–10.5)
Chloride: 106 meq/L (ref 96–112)
Creatinine, Ser: 1 mg/dL (ref 0.40–1.20)
GFR: 60.7 mL/min (ref >60.00)
Glucose, Bld: 76 mg/dL (ref 70–99)
Potassium: 4.1 meq/L (ref 3.5–5.1)
Sodium: 138 meq/L (ref 135–145)
Total Bilirubin: 0.6 mg/dL (ref 0.2–1.2)
Total Protein: 6.9 g/dL (ref 6.0–8.3)

## 2024-02-19 LAB — VITAMIN D 25 HYDROXY (VIT D DEFICIENCY, FRACTURES): VITD: 73.56 ng/mL (ref 30.00–100.00)

## 2024-02-19 NOTE — Patient Instructions (Signed)
 Go to lab Maintain Heart healthy diet and daily exercise. Maintain current medications.  Preventive Care 48-62 Years Old, Female Preventive care refers to lifestyle choices and visits with your health care provider that can promote health and wellness. Preventive care visits are also called wellness exams. What can I expect for my preventive care visit? Counseling Your health care provider may ask you questions about your: Medical history, including: Past medical problems. Family medical history. Pregnancy history. Current health, including: Menstrual cycle. Method of birth control. Emotional well-being. Home life and relationship well-being. Sexual activity and sexual health. Lifestyle, including: Alcohol, nicotine or tobacco, and drug use. Access to firearms. Diet, exercise, and sleep habits. Work and work Astronomer. Sunscreen use. Safety issues such as seatbelt and bike helmet use. Physical exam Your health care provider will check your: Height and weight. These may be used to calculate your BMI (body mass index). BMI is a measurement that tells if you are at a healthy weight. Waist circumference. This measures the distance around your waistline. This measurement also tells if you are at a healthy weight and may help predict your risk of certain diseases, such as type 2 diabetes and high blood pressure. Heart rate and blood pressure. Body temperature. Skin for abnormal spots. What immunizations do I need?  Vaccines are usually given at various ages, according to a schedule. Your health care provider will recommend vaccines for you based on your age, medical history, and lifestyle or other factors, such as travel or where you work. What tests do I need? Screening Your health care provider may recommend screening tests for certain conditions. This may include: Lipid and cholesterol levels. Diabetes screening. This is done by checking your blood sugar (glucose) after you  have not eaten for a while (fasting). Pelvic exam and Pap test. Hepatitis B test. Hepatitis C test. HIV (human immunodeficiency virus) test. STI (sexually transmitted infection) testing, if you are at risk. Lung cancer screening. Colorectal cancer screening. Mammogram. Talk with your health care provider about when you should start having regular mammograms. This may depend on whether you have a family history of breast cancer. BRCA-related cancer screening. This may be done if you have a family history of breast, ovarian, tubal, or peritoneal cancers. Bone density scan. This is done to screen for osteoporosis. Talk with your health care provider about your test results, treatment options, and if necessary, the need for more tests. Follow these instructions at home: Eating and drinking  Eat a diet that includes fresh fruits and vegetables, whole grains, lean protein, and low-fat dairy products. Take vitamin and mineral supplements as recommended by your health care provider. Do not drink alcohol if: Your health care provider tells you not to drink. You are pregnant, may be pregnant, or are planning to become pregnant. If you drink alcohol: Limit how much you have to 0-1 drink a day. Know how much alcohol is in your drink. In the [place] one drink equals one 12 oz bottle of beer (355 mL), one 5 oz glass of wine (148 mL), or one 1 oz glass of hard liquor (44 mL). Lifestyle Brush your teeth every morning and night with fluoride toothpaste. Floss one time each day. Exercise for at least 30 minutes 5 or more days each week. Do not use any products that contain nicotine or tobacco. These products include cigarettes, chewing tobacco, and vaping devices, such as e-cigarettes. If you need help quitting, ask your health care provider. Do not use drugs. If you are  sexually active, practice safe sex. Use a condom or other form of protection to prevent STIs. If you do not wish to become pregnant, use  a form of birth control. If you plan to become pregnant, see your health care provider for a prepregnancy visit. Take aspirin only as told by your health care provider. Make sure that you understand how much to take and what form to take. Work with your health care provider to find out whether it is safe and beneficial for you to take aspirin daily. Find healthy ways to manage stress, such as: Meditation, yoga, or listening to music. Journaling. Talking to a trusted person. Spending time with friends and family. Minimize exposure to UV radiation to reduce your risk of skin cancer. Safety Always wear your seat belt while driving or riding in a vehicle. Do not drive: If you have been drinking alcohol. Do not ride with someone who has been drinking. When you are tired or distracted. While texting. If you have been using any mind-altering substances or drugs. Wear a helmet and other protective equipment during sports activities. If you have firearms in your house, make sure you follow all gun safety procedures. Seek help if you have been physically or sexually abused. What's next? Visit your health care provider once a year for an annual wellness visit. Ask your health care provider how often you should have your eyes and teeth checked. Stay up to date on all vaccines. This information is not intended to replace advice given to you by your health care provider. Make sure you discuss any questions you have with your health care provider. Document Revised: 05/02/2021 Document Reviewed: 05/02/2021 Elsevier Patient Education  2024 [employer].

## 2024-02-19 NOTE — Assessment & Plan Note (Addendum)
 Under the care of Dr. Elberta Fortis Hx of ablation 2006 and 2023 Reports intermittent palpitation with SOB and CP, no syncope Holter moniter completed 2024: per Dr. Elberta Fortis "High percent atrial fibrillation burden.  Would be reasonable to have repeat ablation versus flecainide 50 mg twice daily" Current use of metoprolol prn use Advised to f/up with cardiology
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
## 2024-02-22 ENCOUNTER — Encounter: Payer: Self-pay | Admitting: Nurse Practitioner

## 2024-02-26 ENCOUNTER — Other Ambulatory Visit: Payer: Self-pay | Admitting: Radiology

## 2024-02-26 ENCOUNTER — Other Ambulatory Visit: Payer: Self-pay

## 2024-02-26 DIAGNOSIS — Z6830 Body mass index (BMI) 30.0-30.9, adult: Secondary | ICD-10-CM

## 2024-02-26 MED ORDER — ZEPBOUND 10 MG/0.5ML ~~LOC~~ SOAJ: 10.0000 mg | SUBCUTANEOUS | 0 refills | Status: DC

## 2024-02-26 NOTE — Telephone Encounter (Signed)
 Med refill request: Zepbound 7.5 mg Last OV: 02/04/24  Next OV: 03/03/24 Last AEX: 11/13/23 Last MMG (if hormonal med) n/a Refill authorized: Zepbound 7.5 mg Please approve or deny as appropriate.

## 2024-03-03 ENCOUNTER — Ambulatory Visit: Admitting: Radiology

## 2024-03-18 ENCOUNTER — Other Ambulatory Visit: Payer: Self-pay | Admitting: Radiology

## 2024-03-18 DIAGNOSIS — Z6830 Body mass index (BMI) 30.0-30.9, adult: Secondary | ICD-10-CM

## 2024-03-18 MED ORDER — ZEPBOUND 7.5 MG/0.5ML ~~LOC~~ SOAJ: 7.5000 mg | SUBCUTANEOUS | 0 refills | Status: DC

## 2024-03-19 ENCOUNTER — Ambulatory Visit: Admitting: Radiology

## 2024-04-09 ENCOUNTER — Other Ambulatory Visit: Payer: Self-pay

## 2024-04-09 DIAGNOSIS — Z6830 Body mass index (BMI) 30.0-30.9, adult: Secondary | ICD-10-CM

## 2024-04-09 NOTE — Telephone Encounter (Signed)
 Pt LVM in triage line stating that her last zepbound  injection will be on Sunday 5/25 and the earliest to be seen by JC would be June 10th (no appt currently scheduled)-that date will put her in 2 weeks w/o meds.   Per EMR investigation: pt to schedule 4 week f/u after returning to 7.5 mg dose for check-in (Encounter 03/15/2024)  Will forward back to appt desk to have the pt get scheduled first.

## 2024-04-16 ENCOUNTER — Telehealth: Payer: Self-pay

## 2024-04-16 DIAGNOSIS — Z6830 Body mass index (BMI) 30.0-30.9, adult: Secondary | ICD-10-CM

## 2024-04-16 MED ORDER — ZEPBOUND 7.5 MG/0.5ML ~~LOC~~ SOAJ: 7.5000 mg | SUBCUTANEOUS | 0 refills | Status: DC

## 2024-04-16 MED ORDER — ZEPBOUND 10 MG/0.5ML ~~LOC~~ SOAJ: 10.0000 mg | SUBCUTANEOUS | 0 refills | Status: DC

## 2024-04-16 NOTE — Telephone Encounter (Signed)
 Pls notify the pt that refills for the 7.5 mg has been sent by Dr. Tia Flowers for [facility]. However, will need to r/s her wt check/F/u asap. Thanks.

## 2024-04-16 NOTE — Telephone Encounter (Signed)
  Appointment Request From: Lindsey Bishop   With Provider: CHRZANOWSKI, JAMI B [[facility] ]   Preferred Date Range: Any   Preferred Times: Any Time   Reason for visit: Office Visit   Health Maintenance Topic:    Comments: I am out of the Zepbound  and I see where my June 3 appointment has been canceled. Please advise how we can move forward with this.

## 2024-04-20 ENCOUNTER — Ambulatory Visit: Admitting: Radiology

## 2024-04-20 NOTE — Telephone Encounter (Signed)
 Spoke with patient. OV scheduled for 05/11/24 at 2:00 PM with Jami for med f/u and weight check.   Routing to provider for final review. Patient is agreeable to disposition. Will close encounter.

## 2024-04-20 NOTE — Telephone Encounter (Signed)
 See 04/16/24 encounter, OV scheduled for 05/11/24.

## 2024-04-20 NOTE — Telephone Encounter (Signed)
 Pt now scheduled for 05/11/2024.

## 2024-05-11 ENCOUNTER — Encounter: Payer: Self-pay | Admitting: Radiology

## 2024-05-11 ENCOUNTER — Ambulatory Visit: Admitting: Radiology

## 2024-05-11 DIAGNOSIS — Z6830 Body mass index (BMI) 30.0-30.9, adult: Secondary | ICD-10-CM | POA: Diagnosis not present

## 2024-05-11 MED ORDER — ZEPBOUND 10 MG/0.5ML ~~LOC~~ SOAJ: 10.0000 mg | SUBCUTANEOUS | 3 refills | Status: DC

## 2024-05-11 NOTE — Progress Notes (Signed)
   Lindsey Bishop 07-Oct-1961 [id number]   History: Postmenopausal 63 y.o. presents for follow up after using Zepbound  x7 months. Has lost 45lbs. No side effects, Tolerating well. Would like to move back up to 10mg  (which she previously had nausea and vomiting on) Continuing to exercise regularly.    Past Medical History:  Diagnosis Date   AKI (acute kidney injury) (HCC) 12/24/2014   Class 1 obesity without serious comorbidity with body mass index (BMI) of 30.0 to 30.9 in adult 07/08/2017   patient BMI >30 when she bagan the program   Demand ischemia of myocardium - related to Afib RVR 12/24/2014   Hemorrhoid    Hx of echocardiogram    a. Echo (11/15):  EF 55-60%, no RWMA, normal diastolic function, normal RVF, mild TR, LA 33 mm   Insulin  resistance 09/09/2017   Leukocytosis 12/24/2014   Obesity (BMI 30.0-34.9) 12/24/2014   PAF (paroxysmal atrial fibrillation) (HCC)    a. Multaq 4/11 >>> acute hepatitis (AST 1215, ALT 1343) - Multaq DC'd   Polyp, corpus uteri    Viral URI with cough 12/24/2014   Vitamin D  deficiency 07/08/2017     Obstetric History OB History  Gravida Para Term Preterm AB Living  4 4 4   4   SAB IAB Ectopic Multiple Live Births          # Outcome Date GA Lbr Len/2nd Weight Sex Type Anes PTL Lv  4 Term           3 Term           2 Term           1 Term              The following portions of the patient's history were reviewed and updated as appropriate: allergies, current medications, past family history, past medical history, past social history, past surgical history, and problem list.  Review of Systems Pertinent items noted in HPI and remainder of comprehensive ROS otherwise negative.  Past medical history, past surgical history, family history and social history were all reviewed and documented in the EPIC chart.  Exam:  Vitals:   05/11/24 1357  BP: 104/72  Pulse: 94  SpO2: 99%  Weight: 165 lb 12.8 oz (75.2 kg)   Body mass index is 23.79  kg/m.  Physical Exam Vitals reviewed.  Constitutional:      Appearance: Normal appearance. She is obese.  Pulmonary:     Effort: Pulmonary effort is normal.  Abdominal:     General: Abdomen is flat.     Palpations: Abdomen is soft.   Neurological:     Mental Status: She is alert.   Psychiatric:        Mood and Affect: Mood normal.        Thought Content: Thought content normal.        Judgment: Judgment normal.     Starting weight: 210lbs Current Weight:165lbs First Goal weight: 170lbs Overall goal weight: 155lbs  Assessment/Plan:   1. Morbid obesity (HCC) - tirzepatide  (ZEPBOUND ) 10 MG/0.5ML Pen; Inject 10 mg into the skin once a week.  Dispense: 2 mL; Refill: 3  2. BMI 30.0-30.9,adult - tirzepatide  (ZEPBOUND ) 10 MG/0.5ML Pen; Inject 10 mg into the skin once a week.  Dispense: 2 mL; Refill: 3    Follow up 3months for med check Continue to focus on protein and fiber.  Exercises per week including weight bearing exercise.  Lindsey Bishop B WHNP-BC, 2:00 PM 05/11/2024

## 2024-06-03 ENCOUNTER — Telehealth: Payer: Self-pay

## 2024-06-03 NOTE — Telephone Encounter (Signed)
 Prior authorization request received from covermymeds for Zepbound  10 mg. PA initiated.  Patient notified. KEY: [id number]

## 2024-06-03 NOTE — Telephone Encounter (Signed)
 PA for Zepbound  10 mg was approved.  Patient notified.

## 2024-07-20 ENCOUNTER — Other Ambulatory Visit: Payer: Self-pay | Admitting: Radiology

## 2024-07-20 ENCOUNTER — Telehealth: Payer: Self-pay

## 2024-07-20 DIAGNOSIS — Z6830 Body mass index (BMI) 30.0-30.9, adult: Secondary | ICD-10-CM

## 2024-07-20 MED ORDER — ZEPBOUND 10 MG/0.5ML ~~LOC~~ SOAJ: 10.0000 mg | SUBCUTANEOUS | 0 refills | Status: DC

## 2024-07-20 NOTE — Telephone Encounter (Signed)
 Refill sent.

## 2024-07-20 NOTE — Telephone Encounter (Signed)
 Patient called and left message to states that she was rescheduled from 9/24-9/25 She states that she will be 4 days past due for injection at that time. She didn't know if you wanted to put in a refill or if you wanted her to just wait until the appointment. She states best way to contact her if through [place].

## 2024-07-22 ENCOUNTER — Encounter: Payer: Self-pay | Admitting: Cardiology

## 2024-08-11 ENCOUNTER — Ambulatory Visit: Admitting: Radiology

## 2024-08-12 ENCOUNTER — Encounter: Payer: Self-pay | Admitting: Radiology

## 2024-08-12 ENCOUNTER — Ambulatory Visit: Admitting: Radiology

## 2024-08-12 DIAGNOSIS — Z6830 Body mass index (BMI) 30.0-30.9, adult: Secondary | ICD-10-CM

## 2024-08-12 MED ORDER — ZEPBOUND 10 MG/0.5ML ~~LOC~~ SOAJ: 10.0000 mg | SUBCUTANEOUS | 1 refills | Status: AC

## 2024-08-12 NOTE — Progress Notes (Signed)
   Lindsey Bishop 08-Nov-1961 [id number]   History: Postmenopausal 63 y.o. presents for follow up after using Zepbound  x10 months. Has lost 57lbs. No side effects, Tolerating well. Would like to continue 10mg  dose now that she is at goal weight. Continuing to exercise regularly.    Past Medical History:  Diagnosis Date   AKI (acute kidney injury) 12/24/2014   Class 1 obesity without serious comorbidity with body mass index (BMI) of 30.0 to 30.9 in adult 07/08/2017   patient BMI >30 when she bagan the program   Demand ischemia of myocardium - related to Afib RVR 12/24/2014   Hemorrhoid    Hx of echocardiogram    a. Echo (11/15):  EF 55-60%, no RWMA, normal diastolic function, normal RVF, mild TR, LA 33 mm   Insulin  resistance 09/09/2017   Leukocytosis 12/24/2014   Obesity (BMI 30.0-34.9) 12/24/2014   PAF (paroxysmal atrial fibrillation) (HCC)    a. Multaq 4/11 >>> acute hepatitis (AST 1215, ALT 1343) - Multaq DC'd   Polyp, corpus uteri    Viral URI with cough 12/24/2014   Vitamin D  deficiency 07/08/2017     Obstetric History OB History  Gravida Para Term Preterm AB Living  4 4 4   4   SAB IAB Ectopic Multiple Live Births          # Outcome Date GA Lbr Len/2nd Weight Sex Type Anes PTL Lv  4 Term           3 Term           2 Term           1 Term              The following portions of the patient's history were reviewed and updated as appropriate: allergies, current medications, past family history, past medical history, past social history, past surgical history, and problem list.  Review of Systems Pertinent items noted in HPI and remainder of comprehensive ROS otherwise negative.  Past medical history, past surgical history, family history and social history were all reviewed and documented in the EPIC chart.  Exam:  Vitals:   08/12/24 1452  BP: 112/78  Weight: 153 lb (69.4 kg)   Body mass index is 21.95 kg/m.  Physical Exam Vitals reviewed.  Constitutional:       Appearance: Normal appearance. She is obese.  Pulmonary:     Effort: Pulmonary effort is normal.  Abdominal:     General: Abdomen is flat.     Palpations: Abdomen is soft.  Neurological:     Mental Status: She is alert.  Psychiatric:        Mood and Affect: Mood normal.        Thought Content: Thought content normal.        Judgment: Judgment normal.     Starting weight: 210lbs Current Weight:153lbs First Goal weight: 170lbs Overall goal weight: 155lbs  Assessment/Plan:   1. Morbid obesity (HCC) - tirzepatide  (ZEPBOUND ) 10 MG/0.5ML Pen; Inject 10 mg into the skin once a week.  Dispense: 6 mL; Refill: 1  2. BMI 30.0-30.9,adult - tirzepatide  (ZEPBOUND ) 10 MG/0.5ML Pen; Inject 10 mg into the skin once a week.  Dispense: 6 mL; Refill: 1    Follow up 3months for med check/AEX Continue to focus on protein and fiber.  Exercises per week including weight bearing exercise.  Lindsey Bishop B WHNP-BC, 3:49 PM 08/12/2024

## 2024-09-07 ENCOUNTER — Telehealth: Payer: Self-pay | Admitting: Nurse Practitioner

## 2024-09-07 DIAGNOSIS — Z780 Asymptomatic menopausal state: Secondary | ICD-10-CM

## 2024-09-07 NOTE — Telephone Encounter (Signed)
 Please reorder Dexa scan at [facility]. [facility] is no longer scheduling Dexa/Bone Density scans. Please route message back to Admin team to contact patient for scheduling.

## 2024-11-08 NOTE — Progress Notes (Deleted)
 " Cardiology Office Note:  .   Date:  11/08/2024  ID:  Lindsey Bishop, DOB August 10, 1961, MRN [id number] PCP: Katheen Roselie Rockford, NP  [facility] Providers Cardiologist:  None Electrophysiologist: Dr. Kelsie >>  Lindsey Gladis Norton, MD {  History of Present Illness: .   Lindsey Bishop is a 63 y.o. female w/PMHx of  AFib, SVT  She saw Dr. Norton 11/21/22, despite her 2nd ablation, had continued to have symptoms of Afib, generaaly lasting ~15 min. Questioned etiology of her palpitations, ? If flutter, planned for monitoring  5% AFib burden Recommended repeat ablation vs flecainide Does not appear that any further discussion took place   She saw Jodie 01/22/24, she was reluctant to consider redo ablation. Suspected ETOH as a trigger Added on low dose Toprol  Not on a/c given low risk score.   Today's visit is scheduled as a 6 mo visit ROS:   *** symptoms, burden *** score is one for gender alone    Arrhythmia/AAD hx AFib (PVI) and ORT pathway ablation 10/18/20216 (Dr. Kelsie) AFib ablation (PVI and LA) 05/24/2022, (Dr. Norton)   Studies Reviewed: SABRA    EKG not done today   05/24/22: EPS, ablation CONCLUSIONS: 1. Sinus rhythm upon presentation.   2. Successful electrical isolation and anatomical encircling of all four pulmonary veins with radiofrequency current.  A WACA approach was used 3. Additional left atrial ablation was performed with a standard box lesion created along the posterior wall of the left atrium 4. No early apparent complications.   05/16/22: cardiac CT IMPRESSION: 1. There is normal pulmonary vein drainage into the left atrium with ostial measurements above. 2. There is no thrombus in the left atrial appendage. 3. The esophagus runs in the left atrial midline and is close proximity to the left upper pulmonary vein ostia. 4. No PFO/ASD. 5. Normal coronary origin. Left dominance. 6. CAC score of 63.4 Which is 87 percentile for age-, race-,  and sex-matched controls. 7. Dilated aortic root (42 mm). 8. Mildly dilated pulmonary artery (31 mm).     09/05/2015 EPS, ablation CONCLUSIONS: 1. Sinus rhythm upon presentation.   2. Rotational Angiography reveals a moderate sized left atrium with four separate pulmonary veins without evidence of pulmonary vein stenosis. 3. Successful electrical isolation and anatomical encircling of all four pulmonary veins with radiofrequency current. 4. The patient had documented ORT over a L posteroseptal accessory pathway with CL 360 msec. 5. Successful ablation of a left posteroseptal accessory pathway 6. No evidence of dual AV nodal phyiology 7. No inducible arrhythmias post ablation 8. No early apparent complications.   Risk Assessment/Calculations:    Physical Exam:   VS:  There were no vitals taken for this visit.   Wt Readings from Last 3 Encounters:  08/12/24 153 lb (69.4 kg)  05/11/24 165 lb 12.8 oz (75.2 kg)  02/19/24 180 lb 9.6 oz (81.9 kg)    GEN: Well nourished, well developed in no acute distress NECK: No JVD; No carotid bruits CARDIAC: ***RRR, no murmurs, rubs, gallops RESPIRATORY:  *** CTA b/l without rales, wheezing or rhonchi  ABDOMEN: Soft, non-tender, non-distended EXTREMITIES: *** No edema; No deformity     ASSESSMENT AND PLAN: .    paroxysmal AFib CHA2DS2Vasc is one for gender *** burden by symptoms     {Are you ordering a CV Procedure (e.g. stress test, cath, DCCV, TEE, etc)?   Press F2        :[id number]}     Dispo: ***  Signed, Lindsey Macario Arthur, PA-C   "

## 2024-11-12 ENCOUNTER — Ambulatory Visit: Attending: Physician Assistant | Admitting: Physician Assistant

## 2024-11-15 ENCOUNTER — Ambulatory Visit: Admitting: Radiology

## 2024-11-24 ENCOUNTER — Ambulatory Visit: Admitting: Radiology

## 2024-12-20 ENCOUNTER — Telehealth: Payer: Self-pay

## 2024-12-20 MED ORDER — METOPROLOL SUCCINATE ER 25 MG PO TB24: 12.5000 mg | ORAL_TABLET | Freq: Every day | ORAL | 0 refills | Status: AC

## 2024-12-20 NOTE — Telephone Encounter (Signed)
 Patient requested refill via mychart to last her until upcoming appt scheduled for 03/27. 30 day supply sent.

## 2025-01-05 ENCOUNTER — Ambulatory Visit: Admitting: Radiology

## 2025-02-11 ENCOUNTER — Ambulatory Visit: Admitting: Student
